# Patient Record
Sex: Male | Born: 1970 | Race: Black or African American | Hispanic: No | Marital: Married | Smoking: Never smoker
Health system: Southern US, Community
[De-identification: ages and names within clinical notes are randomized; demographics above are authoritative.]

## PROBLEM LIST (undated history)

## (undated) DIAGNOSIS — M1990 Unspecified osteoarthritis, unspecified site: Secondary | ICD-10-CM

## (undated) DIAGNOSIS — K219 Gastro-esophageal reflux disease without esophagitis: Secondary | ICD-10-CM

## (undated) DIAGNOSIS — T7840XA Allergy, unspecified, initial encounter: Secondary | ICD-10-CM

## (undated) DIAGNOSIS — G4730 Sleep apnea, unspecified: Secondary | ICD-10-CM

## (undated) DIAGNOSIS — F4310 Post-traumatic stress disorder, unspecified: Secondary | ICD-10-CM

## (undated) DIAGNOSIS — E785 Hyperlipidemia, unspecified: Secondary | ICD-10-CM

## (undated) HISTORY — DX: Unspecified osteoarthritis, unspecified site: M19.90

## (undated) HISTORY — PX: OTHER SURGICAL HISTORY: SHX169

## (undated) HISTORY — PX: COLONOSCOPY: SHX174

## (undated) HISTORY — DX: Allergy, unspecified, initial encounter: T78.40XA

## (undated) HISTORY — DX: Hyperlipidemia, unspecified: E78.5

## (undated) HISTORY — DX: Sleep apnea, unspecified: G47.30

## (undated) HISTORY — PX: HAND SURGERY: SHX662

## (undated) HISTORY — DX: Post-traumatic stress disorder, unspecified: F43.10

## (undated) HISTORY — PX: POLYPECTOMY: SHX149

## (undated) HISTORY — DX: Gastro-esophageal reflux disease without esophagitis: K21.9

---

## 2000-06-10 HISTORY — PX: APPENDECTOMY: SHX54

## 2011-07-25 ENCOUNTER — Ambulatory Visit: Payer: Self-pay | Admitting: Internal Medicine

## 2011-07-26 ENCOUNTER — Ambulatory Visit: Payer: Self-pay | Admitting: Internal Medicine

## 2011-08-09 ENCOUNTER — Ambulatory Visit (INDEPENDENT_AMBULATORY_CARE_PROVIDER_SITE_OTHER): Payer: BC Managed Care – PPO | Admitting: Internal Medicine

## 2011-08-09 ENCOUNTER — Encounter: Payer: Self-pay | Admitting: Internal Medicine

## 2011-08-09 VITALS — BP 126/70 | HR 62 | Temp 98.3°F | Resp 16 | Ht 69.0 in | Wt 238.0 lb

## 2011-08-09 DIAGNOSIS — E7800 Pure hypercholesterolemia, unspecified: Secondary | ICD-10-CM

## 2011-08-09 MED ORDER — ATORVASTATIN CALCIUM 40 MG PO TABS: 40.0000 mg | ORAL_TABLET | Freq: Every day | ORAL | Status: DC

## 2011-08-09 NOTE — Assessment & Plan Note (Signed)
His situation is complicated in that he tells me that he had a complete physical with labs today at the [facility] so he deferred on undergoing a complete exam or doing any labs with me today, he tells me that the [facility] will send him a copy of his labs next week and he will forward those results to me for my review. Today he wants to try a different statin so I wrote for lipitor.

## 2011-08-09 NOTE — Patient Instructions (Signed)

## 2011-08-09 NOTE — Progress Notes (Signed)
  Subjective:    Patient ID: Christopher Byrd, male    DOB: 1971/04/18, 41 y.o.   MRN: [id number]  Hyperlipidemia This is a chronic problem. The current episode started more than 1 year ago. The problem is uncontrolled. Recent lipid tests were reviewed and are variable. He has no history of chronic renal disease, diabetes, hypothyroidism, liver disease, obesity or nephrotic syndrome. Factors aggravating his hyperlipidemia include no known factors. Pertinent negatives include no chest pain, focal sensory loss, focal weakness, leg pain, myalgias or shortness of breath. Treatments tried: no meds for 2 weeks now. The current treatment provides moderate improvement of lipids. Compliance problems include adherence to diet and adherence to exercise.       Review of Systems  Constitutional: Negative.   HENT: Negative.   Eyes: Negative.   Respiratory: Negative.  Negative for shortness of breath.   Cardiovascular: Negative.  Negative for chest pain.  Gastrointestinal: Negative.   Genitourinary: Negative.   Musculoskeletal: Negative.  Negative for myalgias.  Skin: Negative.   Neurological: Negative.  Negative for focal weakness.  Hematological: Negative.   Psychiatric/Behavioral: Negative.        Objective:   Physical Exam  Vitals reviewed. Constitutional: He is oriented to person, place, and time. He appears well-developed and well-nourished. No distress.  HENT:  Head: Normocephalic and atraumatic.  Mouth/Throat: Oropharynx is clear and moist. No oropharyngeal exudate.  Eyes: Conjunctivae are normal. Right eye exhibits no discharge. Left eye exhibits no discharge. No scleral icterus.  Neck: Normal range of motion. Neck supple. No JVD present. No tracheal deviation present. No thyromegaly present.  Cardiovascular: Normal rate, regular rhythm, normal heart sounds and intact distal pulses.  Exam reveals no gallop and no friction rub.   No murmur heard. Pulmonary/Chest: Effort normal and breath  sounds normal. No stridor. No respiratory distress. He has no wheezes. He has no rales. He exhibits no tenderness.  Abdominal: Soft. Bowel sounds are normal. He exhibits no distension and no mass. There is no tenderness. There is no rebound and no guarding.  Musculoskeletal: Normal range of motion. He exhibits no edema and no tenderness.  Lymphadenopathy:    He has no cervical adenopathy.  Neurological: He is oriented to person, place, and time.  Skin: Skin is warm and dry. No rash noted. He is not diaphoretic. No erythema. No pallor.  Psychiatric: He has a normal mood and affect. His behavior is normal. Judgment and thought content normal.          Assessment & Plan:

## 2011-08-14 ENCOUNTER — Encounter: Payer: Self-pay | Admitting: Internal Medicine

## 2011-08-14 ENCOUNTER — Ambulatory Visit (INDEPENDENT_AMBULATORY_CARE_PROVIDER_SITE_OTHER): Payer: BC Managed Care – PPO | Admitting: Internal Medicine

## 2011-08-14 VITALS — BP 122/70 | HR 84 | Temp 99.2°F | Resp 16

## 2011-08-14 DIAGNOSIS — J209 Acute bronchitis, unspecified: Secondary | ICD-10-CM | POA: Insufficient documentation

## 2011-08-14 MED ORDER — HYDROCOD POLST-CPM POLST ER 10-8 MG PO CP12: 1.0000 | ORAL_CAPSULE | Freq: Two times a day (BID) | ORAL | Status: DC | PRN

## 2011-08-14 MED ORDER — AZITHROMYCIN 500 MG PO TABS: 500.0000 mg | ORAL_TABLET | Freq: Every day | ORAL | Status: AC

## 2011-08-14 NOTE — Patient Instructions (Signed)

## 2011-08-14 NOTE — Progress Notes (Signed)
  Subjective:    Patient ID: Christopher Byrd, male    DOB: 1970-07-10, 41 y.o.   MRN: [id number]  URI  This is a new problem. The current episode started in the past 7 days. The problem has been gradually worsening. There has been no fever. Associated symptoms include congestion, coughing, rhinorrhea and a sore throat. Pertinent negatives include no abdominal pain, chest pain, diarrhea, dysuria, ear pain, headaches, joint pain, joint swelling, nausea, neck pain, plugged ear sensation, rash, sinus pain, sneezing, swollen glands, vomiting or wheezing. He has tried nothing for the symptoms.      Review of Systems  Constitutional: Negative for fever, chills, diaphoresis, activity change, appetite change, fatigue and unexpected weight change.  HENT: Positive for congestion, sore throat and rhinorrhea. Negative for ear pain, nosebleeds, facial swelling, sneezing, neck pain, dental problem, postnasal drip, sinus pressure and ear discharge.   Eyes: Negative.   Respiratory: Positive for cough. Negative for choking, chest tightness, shortness of breath, wheezing and stridor.   Cardiovascular: Negative for chest pain.  Gastrointestinal: Negative for nausea, vomiting, abdominal pain, diarrhea, constipation and blood in stool.  Genitourinary: Negative.  Negative for dysuria.  Musculoskeletal: Negative for myalgias, back pain, joint pain, joint swelling, arthralgias and gait problem.  Skin: Negative for color change, pallor, rash and wound.  Neurological: Negative.  Negative for headaches.  Hematological: Negative for adenopathy. Does not bruise/bleed easily.  Psychiatric/Behavioral: Negative.        Objective:   Physical Exam  Vitals reviewed. Constitutional: He is oriented to person, place, and time. He appears well-developed and well-nourished. No distress.  HENT:  Head: Normocephalic and atraumatic.  Mouth/Throat: Oropharynx is clear and moist. No oropharyngeal exudate.  Eyes: Conjunctivae are  normal. Right eye exhibits no discharge. Left eye exhibits no discharge. No scleral icterus.  Neck: Normal range of motion. Neck supple. No JVD present. No tracheal deviation present. No thyromegaly present.  Cardiovascular: Normal rate, regular rhythm, normal heart sounds and intact distal pulses.  Exam reveals no gallop and no friction rub.   No murmur heard. Pulmonary/Chest: Effort normal and breath sounds normal. No stridor. No respiratory distress. He has no wheezes. He has no rales. He exhibits no tenderness.  Abdominal: Soft. Bowel sounds are normal. He exhibits no distension and no mass. There is no tenderness. There is no rebound and no guarding.  Musculoskeletal: Normal range of motion. He exhibits no edema and no tenderness.  Lymphadenopathy:    He has no cervical adenopathy.  Neurological: He is oriented to person, place, and time.  Skin: Skin is warm and dry. No rash noted. He is not diaphoretic. No erythema. No pallor.  Psychiatric: He has a normal mood and affect. His behavior is normal. Judgment and thought content normal.          Assessment & Plan:

## 2011-08-14 NOTE — Assessment & Plan Note (Signed)
Start zpak for the infection and a cough suppressant 

## 2011-08-23 ENCOUNTER — Telehealth: Payer: Self-pay | Admitting: Internal Medicine

## 2011-08-23 NOTE — Telephone Encounter (Signed)
Received copies from [facility] ,on 08/23/11. Forwarded 14 pages to Dr. Candi Leash review.

## 2011-09-27 ENCOUNTER — Other Ambulatory Visit: Payer: Self-pay | Admitting: Occupational Medicine

## 2011-09-27 ENCOUNTER — Ambulatory Visit: Payer: Self-pay

## 2011-09-27 DIAGNOSIS — R52 Pain, unspecified: Secondary | ICD-10-CM

## 2012-02-24 ENCOUNTER — Ambulatory Visit (INDEPENDENT_AMBULATORY_CARE_PROVIDER_SITE_OTHER): Payer: BC Managed Care – PPO | Admitting: Internal Medicine

## 2012-02-24 ENCOUNTER — Encounter: Payer: Self-pay | Admitting: Internal Medicine

## 2012-02-24 VITALS — BP 110/70 | HR 68 | Temp 98.3°F | Resp 16 | Wt 245.5 lb

## 2012-02-24 DIAGNOSIS — Z23 Encounter for immunization: Secondary | ICD-10-CM

## 2012-02-24 DIAGNOSIS — L247 Irritant contact dermatitis due to plants, except food: Secondary | ICD-10-CM

## 2012-02-24 DIAGNOSIS — L255 Unspecified contact dermatitis due to plants, except food: Secondary | ICD-10-CM

## 2012-02-24 MED ORDER — CLOBETASOL PROPIONATE 0.05 % EX OINT: TOPICAL_OINTMENT | Freq: Two times a day (BID) | CUTANEOUS | Status: DC

## 2012-02-24 MED ORDER — METHYLPREDNISOLONE ACETATE 80 MG/ML IJ SUSP
120.0000 mg | Freq: Once | INTRAMUSCULAR | Status: AC
  Administered 2012-02-24: 120 mg via INTRAMUSCULAR

## 2012-02-24 NOTE — Assessment & Plan Note (Signed)
He was given depo-medrol in the office and will continue to treat with topical steroids

## 2012-02-24 NOTE — Progress Notes (Signed)
  Subjective:    Patient ID: Christopher Byrd, male    DOB: [birth year], 41 y.o.   MRN: [id number]  Poison [place] This is a recurrent problem. The current episode started in the past 7 days. The problem is unchanged. The affected locations include the left lower leg and right lower leg. The rash is characterized by itchiness, dryness and scaling. He was exposed to plant contact. Pertinent negatives include no anorexia, cough, diarrhea, eye pain, facial edema, fatigue, fever, joint pain, nail changes, rhinorrhea, shortness of breath, sore throat or vomiting. Past treatments include anti-itch cream. The treatment provided mild relief.      Review of Systems  Constitutional: Negative for fever, chills, diaphoresis, activity change, appetite change, fatigue and unexpected weight change.  HENT: Negative.  Negative for sore throat and rhinorrhea.   Eyes: Negative.  Negative for pain.  Respiratory: Negative for cough, chest tightness, shortness of breath, wheezing and stridor.   Cardiovascular: Negative for chest pain, palpitations and leg swelling.  Gastrointestinal: Negative for nausea, vomiting, abdominal pain, diarrhea, constipation, blood in stool and anorexia.  Genitourinary: Negative.   Musculoskeletal: Negative.  Negative for joint pain.  Skin: Positive for rash. Negative for nail changes, color change, pallor and wound.  Neurological: Negative.   Hematological: Negative for adenopathy. Does not bruise/bleed easily.  Psychiatric/Behavioral: Negative.        Objective:   Physical Exam  Vitals reviewed. Constitutional: He appears well-developed and well-nourished. No distress.  HENT:  Head: Normocephalic and atraumatic.  Mouth/Throat: Oropharynx is clear and moist. No oropharyngeal exudate.  Eyes: Conjunctivae normal are normal. Right eye exhibits no discharge. Left eye exhibits no discharge. No scleral icterus.  Neck: Normal range of motion. Neck supple. No JVD present. No tracheal  deviation present. No thyromegaly present.  Cardiovascular: Normal rate, regular rhythm, normal heart sounds and intact distal pulses.  Exam reveals no gallop and no friction rub.   No murmur heard. Pulmonary/Chest: Effort normal and breath sounds normal. No stridor. No respiratory distress. He has no wheezes. He has no rales. He exhibits no tenderness.  Abdominal: Soft. Bowel sounds are normal. He exhibits no distension and no mass. There is no tenderness. There is no rebound and no guarding.  Lymphadenopathy:    He has no cervical adenopathy.  Skin: Skin is warm, dry and intact. Rash noted. No purpura noted. Rash is papular. Rash is not macular, not maculopapular, not nodular, not pustular, not vesicular and not urticarial. He is not diaphoretic.          On his LE's there are classic erythematous papules in streaks and groups          Assessment & Plan:

## 2012-02-24 NOTE — Patient Instructions (Signed)
Poison Ivy Poison ivy is a inflammation of the skin (contact dermatitis) caused by touching the allergens on the leaves of the ivy plant following previous exposure to the plant. The rash usually appears 48 hours after exposure. The rash is usually bumps (papules) or blisters (vesicles) in a linear pattern. Depending on your own sensitivity, the rash may simply cause redness and itching, or it may also progress to blisters which may break open. These must be well cared for to prevent secondary bacterial (germ) infection, followed by scarring. Keep any open areas dry, clean, dressed, and covered with an antibacterial ointment if needed. The eyes may also get puffy. The puffiness is worst in the morning and gets better as the day progresses. This dermatitis usually heals without scarring, within 2 to 3 weeks without treatment. HOME CARE INSTRUCTIONS  Thoroughly wash with soap and water as soon as you have been exposed to poison ivy. You have about one half hour to remove the plant resin before it will cause the rash. This washing will destroy the oil or antigen on the skin that is causing, or will cause, the rash. Be sure to wash under your fingernails as any plant resin there will continue to spread the rash. Do not rub skin vigorously when washing affected area. Poison ivy cannot spread if no oil from the plant remains on your body. A rash that has progressed to weeping sores will not spread the rash unless you have not washed thoroughly. It is also important to wash any clothes you have been wearing as these may carry active allergens. The rash will return if you wear the unwashed clothing, even several days later. Avoidance of the plant in the future is the best measure. Poison ivy plant can be recognized by the number of leaves. Generally, poison ivy has three leaves with flowering branches on a single stem. Diphenhydramine may be purchased over the counter and used as needed for itching. Do not drive with  this medication if it makes you drowsy.Ask your caregiver about medication for children. SEEK MEDICAL CARE IF:  Open sores develop.   Redness spreads beyond area of rash.   You notice purulent (pus-like) discharge.   You have increased pain.   Other signs of infection develop (such as fever).  Document Released: 05/24/2000 Document Revised: 05/16/2011 Document Reviewed: 04/12/2009 [employer] Patient Information 2012 [employer]. 

## 2012-04-08 ENCOUNTER — Ambulatory Visit: Payer: BC Managed Care – PPO | Admitting: General Practice

## 2012-04-08 DIAGNOSIS — Z23 Encounter for immunization: Secondary | ICD-10-CM

## 2012-04-08 MED ORDER — HEPATITIS A VACCINE 1440 EL U/ML IM SUSP: 1.0000 mL | Freq: Once | INTRAMUSCULAR | Status: DC

## 2012-05-11 ENCOUNTER — Other Ambulatory Visit (INDEPENDENT_AMBULATORY_CARE_PROVIDER_SITE_OTHER): Payer: BC Managed Care – PPO

## 2012-05-11 ENCOUNTER — Encounter: Payer: Self-pay | Admitting: Internal Medicine

## 2012-05-11 ENCOUNTER — Ambulatory Visit (INDEPENDENT_AMBULATORY_CARE_PROVIDER_SITE_OTHER): Payer: BC Managed Care – PPO | Admitting: Internal Medicine

## 2012-05-11 ENCOUNTER — Ambulatory Visit (INDEPENDENT_AMBULATORY_CARE_PROVIDER_SITE_OTHER)
Admission: RE | Admit: 2012-05-11 | Discharge: 2012-05-11 | Disposition: A | Discharge status: Home / Self Care | Payer: BC Managed Care – PPO | Source: Ambulatory Visit | Attending: Internal Medicine | Admitting: Internal Medicine

## 2012-05-11 VITALS — BP 118/68 | HR 58 | Temp 97.7°F | Resp 16 | Wt 240.0 lb

## 2012-05-11 DIAGNOSIS — M25569 Pain in unspecified knee: Secondary | ICD-10-CM

## 2012-05-11 DIAGNOSIS — R5381 Other malaise: Secondary | ICD-10-CM

## 2012-05-11 DIAGNOSIS — E7800 Pure hypercholesterolemia, unspecified: Secondary | ICD-10-CM

## 2012-05-11 DIAGNOSIS — Z0000 Encounter for general adult medical examination without abnormal findings: Secondary | ICD-10-CM | POA: Insufficient documentation

## 2012-05-11 DIAGNOSIS — R5383 Other fatigue: Secondary | ICD-10-CM | POA: Insufficient documentation

## 2012-05-11 DIAGNOSIS — M25562 Pain in left knee: Secondary | ICD-10-CM | POA: Insufficient documentation

## 2012-05-11 DIAGNOSIS — M2340 Loose body in knee, unspecified knee: Secondary | ICD-10-CM

## 2012-05-11 LAB — LIPID PANEL
Total CHOL/HDL Ratio: 3
Triglycerides: 83 mg/dL (ref 0.0–149.0)

## 2012-05-11 LAB — URINALYSIS, ROUTINE W REFLEX MICROSCOPIC
Leukocytes, UA: NEGATIVE
Specific Gravity, Urine: 1.02 (ref 1.000–1.030)
Urine Glucose: NEGATIVE
Urobilinogen, UA: 0.2 (ref 0.0–1.0)
pH: 7 (ref 5.0–8.0)

## 2012-05-11 LAB — COMPREHENSIVE METABOLIC PANEL
AST: 29 U/L (ref 0–37)
Albumin: 4 g/dL (ref 3.5–5.2)
BUN: 14 mg/dL (ref 6–23)
CO2: 29 mEq/L (ref 19–32)
Calcium: 9.2 mg/dL (ref 8.4–10.5)
Chloride: 103 mEq/L (ref 96–112)
GFR: 90.99 mL/min (ref >60.00)
Glucose, Bld: 95 mg/dL (ref 70–99)
Potassium: 4.4 mEq/L (ref 3.5–5.1)

## 2012-05-11 LAB — CBC WITH DIFFERENTIAL/PLATELET
Basophils Absolute: 0 10*3/uL (ref 0.0–0.1)
Eosinophils Absolute: 0 10*3/uL (ref 0.0–0.7)
Lymphocytes Relative: 21.5 % (ref 12.0–46.0)
MCHC: 33.5 g/dL (ref 30.0–36.0)
MCV: 89.6 fl (ref 78.0–100.0)
Monocytes Absolute: 0.5 10*3/uL (ref 0.1–1.0)
Neutrophils Relative %: 69.8 % (ref 43.0–77.0)
RDW: 13.8 % (ref 11.5–14.6)

## 2012-05-11 LAB — TSH: TSH: 1.02 u[IU]/mL (ref 0.35–5.50)

## 2012-05-11 NOTE — Addendum Note (Signed)
Addended by: Etta Grandchild on: 05/11/2012 01:03 PM   Modules accepted: Orders

## 2012-05-11 NOTE — Assessment & Plan Note (Signed)
Exam done Vaccines were reviewed Labs ordered Pt ed material was given 

## 2012-05-11 NOTE — Progress Notes (Signed)
Subjective:    Patient ID: Christopher Byrd, male    DOB: Jul 02, 1970, 41 y.o.   MRN: [id number]  Knee Pain  The incident occurred 3 to 5 days ago. There was no injury mechanism. The pain is present in the left knee. The quality of the pain is described as aching. The pain is at a severity of 1/10 (he feels like his left knee "catches"). The pain is mild. The pain has been intermittent since onset. Pertinent negatives include no inability to bear weight, loss of motion, loss of sensation, muscle weakness, numbness or tingling. He reports no foreign bodies present. The symptoms are aggravated by movement. He has tried nothing for the symptoms. The treatment provided no relief.      Review of Systems  Constitutional: Positive for fatigue. Negative for fever, diaphoresis, activity change, appetite change and unexpected weight change.  HENT: Negative.   Eyes: Negative.   Respiratory: Negative.  Negative for apnea, cough, choking, chest tightness, shortness of breath and wheezing.   Cardiovascular: Negative.  Negative for chest pain, palpitations and leg swelling.  Gastrointestinal: Negative.  Negative for nausea, vomiting, abdominal pain, diarrhea, constipation and blood in stool.  Genitourinary: Negative.   Musculoskeletal: Positive for arthralgias (left knee). Negative for myalgias, back pain, joint swelling and gait problem.  Skin: Negative.  Negative for color change, pallor, rash and wound.  Neurological: Negative.  Negative for tingling and numbness.  Hematological: Negative.   Psychiatric/Behavioral: Negative.        Objective:   Physical Exam  Vitals reviewed. Constitutional: He is oriented to person, place, and time. He appears well-developed and well-nourished. No distress.  HENT:  Head: Normocephalic and atraumatic.  Mouth/Throat: Oropharynx is clear and moist. No oropharyngeal exudate.  Eyes: Conjunctivae normal are normal. Right eye exhibits no discharge. Left eye exhibits no  discharge. No scleral icterus.  Neck: Normal range of motion. Neck supple. No JVD present. No tracheal deviation present. No thyromegaly present.  Cardiovascular: Normal rate, regular rhythm, normal heart sounds and intact distal pulses.  Exam reveals no gallop and no friction rub.   No murmur heard. Pulmonary/Chest: Effort normal and breath sounds normal. No stridor. No respiratory distress. He has no wheezes. He has no rales. He exhibits no tenderness.  Abdominal: Soft. Bowel sounds are normal. He exhibits no distension and no mass. There is no tenderness. There is no rebound and no guarding. Hernia confirmed negative in the right inguinal area and confirmed negative in the left inguinal area.  Genitourinary: Rectum normal, prostate normal, testes normal and penis normal. Rectal exam shows no external hemorrhoid, no internal hemorrhoid, no fissure, no mass, no tenderness and anal tone normal. Guaiac negative stool. Prostate is not enlarged and not tender. Right testis shows no mass, no swelling and no tenderness. Right testis is descended. Left testis shows no mass, no swelling and no tenderness. Left testis is descended. Circumcised. No penile erythema or penile tenderness. No discharge found.  Musculoskeletal: Normal range of motion. He exhibits no edema and no tenderness.       Left knee: He exhibits deformity (very mild crepitance). He exhibits normal range of motion, no swelling, no effusion, no ecchymosis, no laceration, normal alignment, normal patellar mobility, no bony tenderness, normal meniscus and no MCL laxity. no tenderness found. No medial joint line, no lateral joint line, no MCL, no LCL and no patellar tendon tenderness noted.  Lymphadenopathy:    He has no cervical adenopathy.       Right:  No inguinal adenopathy present.       Left: No inguinal adenopathy present.  Neurological: He is oriented to person, place, and time.  Skin: Skin is warm and dry. No rash noted. He is not  diaphoretic. No erythema. No pallor.  Psychiatric: He has a normal mood and affect. His behavior is normal. Judgment and thought content normal.      No results found for this basename: WBC, HGB, HCT, PLT, GLUCOSE, CHOL, TRIG, HDL, LDLDIRECT, LDLCALC, ALT, AST, NA, K, CL, CREATININE, BUN, CO2, TSH, PSA, INR, GLUF, HGBA1C, MICROALBUR      Assessment & Plan:

## 2012-05-11 NOTE — Assessment & Plan Note (Signed)
See xray report, ortho referral

## 2012-05-11 NOTE — Assessment & Plan Note (Signed)
I will check his testosterone level and other labs to see if there is a secondary cause for this

## 2012-05-11 NOTE — Patient Instructions (Signed)
Knee Pain The knee is the complex joint between your thigh and your lower leg. It is made up of bones, tendons, ligaments, and cartilage. The bones that make up the knee are:  The femur in the thigh.  The tibia and fibula in the lower leg.  The patella or kneecap riding in the groove on the lower femur. CAUSES  Knee pain is a common complaint with many causes. A few of these causes are:  Injury, such as:  A ruptured ligament or tendon injury.  Torn cartilage.  Medical conditions, such as:  Gout  Arthritis  Infections  Overuse, over training or overdoing a physical activity. Knee pain can be minor or severe. Knee pain can accompany debilitating injury. Minor knee problems often respond well to self-care measures or get well on their own. More serious injuries may need medical intervention or even surgery. SYMPTOMS The knee is complex. Symptoms of knee problems can vary widely. Some of the problems are:  Pain with movement and weight bearing.  Swelling and tenderness.  Buckling of the knee.  Inability to straighten or extend your knee.  Your knee locks and you cannot straighten it.  Warmth and redness with pain and fever.  Deformity or dislocation of the kneecap. DIAGNOSIS  Determining what is wrong may be very straight forward such as when there is an injury. It can also be challenging because of the complexity of the knee. Tests to make a diagnosis may include:  Your caregiver taking a history and doing a physical exam.  Routine X-rays can be used to rule out other problems. X-rays will not reveal a cartilage tear. Some injuries of the knee can be diagnosed by:  Arthroscopy a surgical technique by which a small video camera is inserted through tiny incisions on the sides of the knee. This procedure is used to examine and repair internal knee joint problems. Tiny instruments can be used during arthroscopy to repair the torn knee cartilage (meniscus).  Arthrography  is a radiology technique. A contrast liquid is directly injected into the knee joint. Internal structures of the knee joint then become visible on X-ray film.  An MRI scan is a non x-ray radiology procedure in which magnetic fields and a computer produce two- or three-dimensional images of the inside of the knee. Cartilage tears are often visible using an MRI scanner. MRI scans have largely replaced arthrography in diagnosing cartilage tears of the knee.  Blood work.  Examination of the fluid that helps to lubricate the knee joint (synovial fluid). This is done by taking a sample out using a needle and a syringe. TREATMENT The treatment of knee problems depends on the cause. Some of these treatments are:  Depending on the injury, proper casting, splinting, surgery or physical therapy care will be needed.  Give yourself adequate recovery time. Do not overuse your joints. If you begin to get sore during workout routines, back off. Slow down or do fewer repetitions.  For repetitive activities such as cycling or running, maintain your strength and nutrition.  Alternate muscle groups. For example if you are a weight lifter, work the upper body on one day and the lower body the next.  Either tight or weak muscles do not give the proper support for your knee. Tight or weak muscles do not absorb the stress placed on the knee joint. Keep the muscles surrounding the knee strong.  Take care of mechanical problems.  If you have flat feet, orthotics or special shoes may help.   See your caregiver if you need help.  Arch supports, sometimes with wedges on the inner or outer aspect of the heel, can help. These can shift pressure away from the side of the knee most bothered by osteoarthritis.  A brace called an "unloader" brace also may be used to help ease the pressure on the most arthritic side of the knee.  If your caregiver has prescribed crutches, braces, wraps or ice, use as directed. The acronym for  this is PRICE. This means protection, rest, ice, compression and elevation.  Nonsteroidal anti-inflammatory drugs (NSAID's), can help relieve pain. But if taken immediately after an injury, they may actually increase swelling. Take NSAID's with food in your stomach. Stop them if you develop stomach problems. Do not take these if you have a history of ulcers, stomach pain or bleeding from the bowel. Do not take without your caregiver's approval if you have problems with fluid retention, heart failure, or kidney problems.  For ongoing knee problems, physical therapy may be helpful.  Glucosamine and chondroitin are over-the-counter dietary supplements. Both may help relieve the pain of osteoarthritis in the knee. These medicines are different from the usual anti-inflammatory drugs. Glucosamine may decrease the rate of cartilage destruction.  Injections of a corticosteroid drug into your knee joint may help reduce the symptoms of an arthritis flare-up. They may provide pain relief that lasts a few months. You may have to wait a few months between injections. The injections do have a small increased risk of infection, water retention and elevated blood sugar levels.  Hyaluronic acid injected into damaged joints may ease pain and provide lubrication. These injections may work by reducing inflammation. A series of shots may give relief for as long as 6 months.  Topical painkillers. Applying certain ointments to your skin may help relieve the pain and stiffness of osteoarthritis. Ask your pharmacist for suggestions. Many over the-counter products are approved for temporary relief of arthritis pain.  In some countries, doctors often prescribe topical NSAID's for relief of chronic conditions such as arthritis and tendinitis. A review of treatment with NSAID creams found that they worked as well as oral medications but without the serious side effects. PREVENTION  Maintain a healthy weight. Extra pounds put  more strain on your joints.  Get strong, stay limber. Weak muscles are a common cause of knee injuries. Stretching is important. Include flexibility exercises in your workouts.  Be smart about exercise. If you have osteoarthritis, chronic knee pain or recurring injuries, you may need to change the way you exercise. This does not mean you have to stop being active. If your knees ache after jogging or playing basketball, consider switching to swimming, water aerobics or other low-impact activities, at least for a few days a week. Sometimes limiting high-impact activities will provide relief.  Make sure your shoes fit well. Choose footwear that is right for your sport.  Protect your knees. Use the proper gear for knee-sensitive activities. Use kneepads when playing volleyball or laying carpet. Buckle your seat belt every time you drive. Most shattered kneecaps occur in car accidents.  Rest when you are tired. SEEK MEDICAL CARE IF:  You have knee pain that is continual and does not seem to be getting better.  SEEK IMMEDIATE MEDICAL CARE IF:  Your knee joint feels hot to the touch and you have a high fever. MAKE SURE YOU:   Understand these instructions.  Will watch your condition.  Will get help right away if you are not   doing well or get worse. Document Released: 03/24/2007 Document Revised: 08/19/2011 Document Reviewed: 03/24/2007 [employer] Patient Information 2013 [employer]. Health Maintenance, Males A healthy lifestyle and preventative care can promote health and wellness.  Maintain regular health, dental, and eye exams.  Eat a healthy diet. Foods like vegetables, fruits, whole grains, low-fat dairy products, and lean protein foods contain the nutrients you need without too many calories. Decrease your intake of foods high in solid fats, added sugars, and salt. Get information about a proper diet from your caregiver, if necessary.  Regular physical exercise is one of the most  important things you can do for your health. Most adults should get at least 150 minutes of moderate-intensity exercise (any activity that increases your heart rate and causes you to sweat) each week. In addition, most adults need muscle-strengthening exercises on 2 or more days a week.   Maintain a healthy weight. The body mass index (BMI) is a screening tool to identify possible weight problems. It provides an estimate of body fat based on height and weight. Your caregiver can help determine your BMI, and can help you achieve or maintain a healthy weight. For adults 20 years and older:  A BMI below 18.5 is considered underweight.  A BMI of 18.5 to 24.9 is normal.  A BMI of 25 to 29.9 is considered overweight.  A BMI of 30 and above is considered obese.  Maintain normal blood lipids and cholesterol by exercising and minimizing your intake of saturated fat. Eat a balanced diet with plenty of fruits and vegetables. Blood tests for lipids and cholesterol should begin at age 20 and be repeated every 5 years. If your lipid or cholesterol levels are high, you are over 50, or you are a high risk for heart disease, you may need your cholesterol levels checked more frequently.Ongoing high lipid and cholesterol levels should be treated with medicines, if diet and exercise are not effective.  If you smoke, find out from your caregiver how to quit. If you do not use tobacco, do not start.  If you choose to drink alcohol, do not exceed 2 drinks per day. One drink is considered to be 12 ounces (355 mL) of beer, 5 ounces (148 mL) of wine, or 1.5 ounces (44 mL) of liquor.  Avoid use of street drugs. Do not share needles with anyone. Ask for help if you need support or instructions about stopping the use of drugs.  High blood pressure causes heart disease and increases the risk of stroke. Blood pressure should be checked at least every 1 to 2 years. Ongoing high blood pressure should be treated with medicines  if weight loss and exercise are not effective.  If you are 45 to 43 years old, ask your caregiver if you should take aspirin to prevent heart disease.  Diabetes screening involves taking a blood sample to check your fasting blood sugar level. This should be done once every 3 years, after age 45, if you are within normal weight and without risk factors for diabetes. Testing should be considered at a younger age or be carried out more frequently if you are overweight and have at least 1 risk factor for diabetes.  Colorectal cancer can be detected and often prevented. Most routine colorectal cancer screening begins at the age of 50 and continues through age 75. However, your caregiver may recommend screening at an earlier age if you have risk factors for colon cancer. On a yearly basis, your caregiver may provide home   test kits to check for hidden blood in the stool. Use of a small camera at the end of a tube, to directly examine the colon (sigmoidoscopy or colonoscopy), can detect the earliest forms of colorectal cancer. Talk to your caregiver about this at age 50, when routine screening begins. Direct examination of the colon should be repeated every 5 to 10 years through age 75, unless early forms of pre-cancerous polyps or small growths are found.  Hepatitis C blood testing is recommended for all people born from 1945 through 1965 and any individual with known risks for hepatitis C.  Healthy men should no longer receive prostate-specific antigen (PSA) blood tests as part of routine cancer screening. Consult with your caregiver about prostate cancer screening.  Testicular cancer screening is not recommended for adolescents or adult males who have no symptoms. Screening includes self-exam, caregiver exam, and other screening tests. Consult with your caregiver about any symptoms you have or any concerns you have about testicular cancer.  Practice safe sex. Use condoms and avoid high-risk sexual practices  to reduce the spread of sexually transmitted infections (STIs).  Use sunscreen with a sun protection factor (SPF) of 30 or greater. Apply sunscreen liberally and repeatedly throughout the day. You should seek shade when your shadow is shorter than you. Protect yourself by wearing long sleeves, pants, a wide-brimmed hat, and sunglasses year round, whenever you are outdoors.  Notify your caregiver of new moles or changes in moles, especially if there is a change in shape or color. Also notify your caregiver if a mole is larger than the size of a pencil eraser.  A one-time screening for abdominal aortic aneurysm (AAA) and surgical repair of large AAAs by sound wave imaging (ultrasonography) is recommended for ages 65 to 75 years who are current or former smokers.  Stay current with your immunizations. Document Released: 11/23/2007 Document Revised: 08/19/2011 Document Reviewed: 10/22/2010 [employer] Patient Information 2013 [employer].  

## 2012-05-11 NOTE — Assessment & Plan Note (Signed)
I will check a plain film today to see if there is any DJD

## 2012-10-19 ENCOUNTER — Encounter: Payer: Self-pay | Admitting: Internal Medicine

## 2012-10-19 ENCOUNTER — Ambulatory Visit (INDEPENDENT_AMBULATORY_CARE_PROVIDER_SITE_OTHER): Payer: BC Managed Care – PPO | Admitting: Internal Medicine

## 2012-10-19 VITALS — BP 118/68 | HR 60 | Temp 97.8°F | Resp 16 | Wt 241.0 lb

## 2012-10-19 DIAGNOSIS — E7800 Pure hypercholesterolemia, unspecified: Secondary | ICD-10-CM

## 2012-10-19 DIAGNOSIS — H918X9 Other specified hearing loss, unspecified ear: Secondary | ICD-10-CM

## 2012-10-19 DIAGNOSIS — H6120 Impacted cerumen, unspecified ear: Secondary | ICD-10-CM

## 2012-10-19 MED ORDER — ATORVASTATIN CALCIUM 40 MG PO TABS: 40.0000 mg | ORAL_TABLET | Freq: Every day | ORAL | Status: DC

## 2012-10-19 NOTE — Assessment & Plan Note (Signed)
He is doing well on lipitor 

## 2012-10-19 NOTE — Assessment & Plan Note (Signed)
Success irrigation

## 2012-10-19 NOTE — Patient Instructions (Signed)

## 2012-10-19 NOTE — Progress Notes (Signed)
Subjective:    Patient ID: Christopher Byrd, male    DOB: Sep 22, 1970, 42 y.o.   MRN: [id number]  Hyperlipidemia This is a chronic problem. The current episode started more than 1 year ago. The problem is controlled. Recent lipid tests were reviewed and are variable. He has no history of chronic renal disease, diabetes, hypothyroidism, liver disease, obesity or nephrotic syndrome. There are no known factors aggravating his hyperlipidemia. Pertinent negatives include no chest pain, focal sensory loss, focal weakness, leg pain, myalgias or shortness of breath. Current antihyperlipidemic treatment includes statins. The current treatment provides significant improvement of lipids. There are no compliance problems.       Review of Systems  Constitutional: Negative.  Negative for fever, chills, diaphoresis, activity change, appetite change, fatigue and unexpected weight change.  HENT: Positive for hearing loss (wax in his left ear). Negative for ear pain, facial swelling, drooling, dental problem, sinus pressure and tinnitus.   Eyes: Negative.   Respiratory: Negative.  Negative for cough, choking, chest tightness, shortness of breath, wheezing and stridor.   Cardiovascular: Negative.  Negative for chest pain, palpitations and leg swelling.  Gastrointestinal: Negative.  Negative for nausea, vomiting, abdominal pain, diarrhea and constipation.  Endocrine: Negative.   Genitourinary: Negative.   Musculoskeletal: Negative.  Negative for myalgias, back pain, joint swelling, arthralgias and gait problem.  Skin: Negative.   Allergic/Immunologic: Negative.   Neurological: Negative.  Negative for focal weakness.  Hematological: Negative.   Psychiatric/Behavioral: Negative.        Objective:   Physical Exam  Vitals reviewed. Constitutional: He is oriented to person, place, and time. He appears well-developed and well-nourished. No distress.  HENT:  Head: Normocephalic and atraumatic.  Right Ear:  Hearing, tympanic membrane, external ear and ear canal normal.  Left Ear: Hearing and external ear normal. A foreign body (cerumen impaction on left EAC) is present.  Mouth/Throat: No oropharyngeal exudate.  Colace was placed in left eac, then it was irrigated, cerumen was easily removed, after the irrigation was done I reexamined the ear and found it to be normal  Eyes: Conjunctivae are normal. Right eye exhibits no discharge. Left eye exhibits no discharge. No scleral icterus.  Neck: Normal range of motion. Neck supple. No JVD present. No tracheal deviation present. No thyromegaly present.  Cardiovascular: Normal rate, regular rhythm, normal heart sounds and intact distal pulses.  Exam reveals no gallop and no friction rub.   No murmur heard. Pulmonary/Chest: Effort normal and breath sounds normal. No stridor. No respiratory distress. He has no wheezes. He has no rales. He exhibits no tenderness.  Abdominal: Soft. Bowel sounds are normal. He exhibits no distension and no mass. There is no tenderness. There is no rebound and no guarding.  Musculoskeletal: Normal range of motion. He exhibits no edema and no tenderness.  Lymphadenopathy:    He has no cervical adenopathy.  Neurological: He is oriented to person, place, and time.  Skin: Skin is warm and dry. No rash noted. He is not diaphoretic. No erythema. No pallor.  Psychiatric: He has a normal mood and affect. His behavior is normal. Judgment and thought content normal.     Lab Results  Component Value Date   WBC 5.9 05/11/2012   HGB 15.1 05/11/2012   HCT 45.1 05/11/2012   PLT 202.0 05/11/2012   GLUCOSE 95 05/11/2012   CHOL 144 05/11/2012   TRIG 83.0 05/11/2012   HDL 42.90 05/11/2012   LDLCALC 85 05/11/2012   ALT 42 05/11/2012  AST 29 05/11/2012   NA 139 05/11/2012   K 4.4 05/11/2012   CL 103 05/11/2012   CREATININE 1.1 05/11/2012   BUN 14 05/11/2012   CO2 29 05/11/2012   TSH 1.02 05/11/2012   PSA 0.27 05/11/2012       Assessment & Plan:

## 2012-12-10 ENCOUNTER — Ambulatory Visit (INDEPENDENT_AMBULATORY_CARE_PROVIDER_SITE_OTHER): Payer: BC Managed Care – PPO | Admitting: Internal Medicine

## 2012-12-10 ENCOUNTER — Encounter: Payer: Self-pay | Admitting: Internal Medicine

## 2012-12-10 ENCOUNTER — Ambulatory Visit (INDEPENDENT_AMBULATORY_CARE_PROVIDER_SITE_OTHER)
Admission: RE | Admit: 2012-12-10 | Discharge: 2012-12-10 | Disposition: A | Discharge status: Home / Self Care | Payer: BC Managed Care – PPO | Source: Ambulatory Visit | Attending: Internal Medicine | Admitting: Internal Medicine

## 2012-12-10 VITALS — BP 110/76 | HR 64 | Temp 98.4°F | Resp 16 | Wt 237.0 lb

## 2012-12-10 DIAGNOSIS — M545 Low back pain, unspecified: Secondary | ICD-10-CM

## 2012-12-10 DIAGNOSIS — M5416 Radiculopathy, lumbar region: Secondary | ICD-10-CM | POA: Insufficient documentation

## 2012-12-10 MED ORDER — TRAMADOL HCL 50 MG PO TABS: 50.0000 mg | ORAL_TABLET | Freq: Three times a day (TID) | ORAL | Status: DC | PRN

## 2012-12-10 NOTE — Assessment & Plan Note (Signed)
He will continue the meds given to him by the [place] He will also try tramadol for pain relief

## 2012-12-10 NOTE — Progress Notes (Signed)
Subjective:    Patient ID: Christopher Byrd, male    DOB: [birth year], 42 y.o.   MRN: [id number]  Back Pain This is a recurrent problem. The current episode started in the past 7 days. The problem occurs intermittently. The problem is unchanged. The pain is present in the lumbar spine. The quality of the pain is described as aching. The pain does not radiate. The pain is at a severity of 3/10. The pain is mild. The pain is worse during the day. The symptoms are aggravated by bending and standing. Pertinent negatives include no abdominal pain, bladder incontinence, bowel incontinence, chest pain, dysuria, fever, headaches, leg pain, numbness, paresis, paresthesias, pelvic pain, perianal numbness, tingling, weakness or weight loss. He has tried NSAIDs and muscle relaxant (? meds given to him by the [place]) for the symptoms. The treatment provided mild relief.      Review of Systems  Constitutional: Negative.  Negative for fever and weight loss.  HENT: Negative.   Eyes: Negative.   Respiratory: Negative.   Cardiovascular: Negative.  Negative for chest pain, palpitations and leg swelling.  Gastrointestinal: Negative.  Negative for abdominal pain and bowel incontinence.  Endocrine: Negative.   Genitourinary: Negative.  Negative for bladder incontinence, dysuria and pelvic pain.  Musculoskeletal: Positive for back pain. Negative for myalgias, joint swelling, arthralgias and gait problem.  Skin: Negative.   Allergic/Immunologic: Negative.   Neurological: Negative for dizziness, tingling, tremors, weakness, numbness, headaches and paresthesias.  Hematological: Negative.  Negative for adenopathy. Does not bruise/bleed easily.  Psychiatric/Behavioral: Negative.        Objective:   Physical Exam  Vitals reviewed. Constitutional: He is oriented to person, place, and time. He appears well-developed and well-nourished. No distress.  HENT:  Head: Normocephalic and atraumatic.  Mouth/Throat: Oropharynx  is clear and moist. No oropharyngeal exudate.  Eyes: Conjunctivae are normal. Right eye exhibits no discharge. No scleral icterus.  Neck: Normal range of motion. Neck supple. No JVD present. No tracheal deviation present. No thyromegaly present.  Cardiovascular: Normal rate, regular rhythm, normal heart sounds and intact distal pulses.  Exam reveals no gallop and no friction rub.   No murmur heard. Pulmonary/Chest: Effort normal and breath sounds normal. No stridor. No respiratory distress. He has no wheezes. He has no rales. He exhibits no tenderness.  Abdominal: Soft. Bowel sounds are normal. He exhibits no distension and no mass. There is no tenderness. There is no rebound and no guarding.  Musculoskeletal: Normal range of motion. He exhibits no edema and no tenderness.       Lumbar back: Normal. He exhibits normal range of motion, no tenderness, no bony tenderness, no swelling, no edema, no deformity, no laceration, no pain, no spasm and normal pulse.  Lymphadenopathy:    He has no cervical adenopathy.  Neurological: He is alert and oriented to person, place, and time. He has normal strength. He displays no atrophy, no tremor and normal reflexes. No cranial nerve deficit or sensory deficit. He exhibits normal muscle tone. He displays a negative Romberg sign. He displays no seizure activity. Coordination and gait normal.  Reflex Scores:      Tricep reflexes are 1+ on the right side and 1+ on the left side.      Bicep reflexes are 1+ on the right side and 1+ on the left side.      Brachioradialis reflexes are 1+ on the right side and 1+ on the left side.      Patellar reflexes are 1+  on the right side and 1+ on the left side.      Achilles reflexes are 1+ on the right side and 1+ on the left side. Neg SLR in BLE  Skin: Skin is warm and dry. No rash noted. He is not diaphoretic. No erythema. No pallor.  Psychiatric: He has a normal mood and affect. His behavior is normal. Judgment and thought  content normal.          Assessment & Plan:

## 2012-12-10 NOTE — Patient Instructions (Signed)

## 2013-04-15 ENCOUNTER — Encounter: Payer: Self-pay | Admitting: Internal Medicine

## 2013-04-15 ENCOUNTER — Other Ambulatory Visit: Payer: Self-pay

## 2013-04-15 ENCOUNTER — Ambulatory Visit (INDEPENDENT_AMBULATORY_CARE_PROVIDER_SITE_OTHER): Payer: BC Managed Care – PPO | Admitting: Internal Medicine

## 2013-04-15 VITALS — BP 120/70 | HR 58 | Temp 98.1°F | Resp 16 | Ht 69.0 in | Wt 241.0 lb

## 2013-04-15 DIAGNOSIS — M2340 Loose body in knee, unspecified knee: Secondary | ICD-10-CM | POA: Insufficient documentation

## 2013-04-15 DIAGNOSIS — N529 Male erectile dysfunction, unspecified: Secondary | ICD-10-CM

## 2013-04-15 DIAGNOSIS — M2341 Loose body in knee, right knee: Secondary | ICD-10-CM

## 2013-04-15 MED ORDER — VARDENAFIL HCL 20 MG PO TABS: 20.0000 mg | ORAL_TABLET | Freq: Every day | ORAL | Status: DC | PRN

## 2013-04-15 NOTE — Progress Notes (Signed)
Pre visit review using our clinic review tool, if applicable. No additional management support is needed unless otherwise documented below in the visit note. 

## 2013-04-15 NOTE — Assessment & Plan Note (Signed)
He will try levitra for this 

## 2013-04-15 NOTE — Patient Instructions (Signed)

## 2013-04-15 NOTE — Progress Notes (Signed)
Subjective:    Patient ID: Christopher Byrd, male    DOB: [birth year], 42 y.o.   MRN: [id number]  Knee Pain  The incident occurred more than 1 week ago. Incident location: in active duty. The pain is present in the right knee. The quality of the pain is described as aching. The pain is at a severity of 2/10. The pain is mild. The pain has been worsening since onset. Pertinent negatives include no inability to bear weight, loss of motion, loss of sensation, muscle weakness, numbness or tingling. The symptoms are aggravated by movement and palpation. He has tried NSAIDs for the symptoms. The treatment provided moderate relief.  Erectile Dysfunction This is a chronic problem. The current episode started more than 1 year ago. The problem is unchanged. The nature of his difficulty is achieving erection, maintaining erection and penetration. He reports no anxiety, decreased libido or performance anxiety. He reports his erection duration to be 1 to 5 minutes. Irritative symptoms do not include frequency, nocturia or urgency. Obstructive symptoms do not include dribbling, incomplete emptying, an intermittent stream, a slower stream, straining or a weak stream. Pertinent negatives include no chills, dysuria, genital pain, hematuria, hesitancy or inability to urinate. Nothing aggravates the symptoms. Past treatments include tadalafil. The treatment provided mild relief. He has had no adverse reactions caused by medications.      Review of Systems  Constitutional: Negative.  Negative for fever, chills, diaphoresis, appetite change and fatigue.  HENT: Negative.   Eyes: Negative.   Respiratory: Negative.  Negative for cough, chest tightness, shortness of breath, wheezing and stridor.   Cardiovascular: Negative.  Negative for chest pain, palpitations and leg swelling.  Gastrointestinal: Negative.  Negative for nausea, vomiting, abdominal pain, diarrhea, constipation and blood in stool.  Endocrine: Negative.    Genitourinary: Negative.  Negative for dysuria, hesitancy, urgency, frequency, hematuria, decreased libido, incomplete emptying and nocturia.  Musculoskeletal: Positive for arthralgias. Negative for back pain, gait problem, joint swelling, myalgias, neck pain and neck stiffness.  Skin: Negative.   Allergic/Immunologic: Negative.   Neurological: Negative.  Negative for tingling and numbness.  Hematological: Negative.  Negative for adenopathy. Does not bruise/bleed easily.  Psychiatric/Behavioral: Negative.        Objective:   Physical Exam  Vitals reviewed. Constitutional: He is oriented to person, place, and time. He appears well-developed and well-nourished. No distress.  HENT:  Head: Normocephalic and atraumatic.  Mouth/Throat: Oropharynx is clear and moist. No oropharyngeal exudate.  Eyes: Conjunctivae are normal. Right eye exhibits no discharge. Left eye exhibits no discharge. No scleral icterus.  Neck: Normal range of motion. Neck supple. No JVD present. No tracheal deviation present. No thyromegaly present.  Cardiovascular: Normal rate, regular rhythm, normal heart sounds and intact distal pulses.  Exam reveals no gallop and no friction rub.   No murmur heard. Pulmonary/Chest: Effort normal and breath sounds normal. No stridor. No respiratory distress. He has no wheezes. He has no rales. He exhibits no tenderness.  Abdominal: Soft. Bowel sounds are normal. He exhibits no distension and no mass. There is no tenderness. There is no rebound and no guarding.  Musculoskeletal: Normal range of motion. He exhibits no edema and no tenderness.       Right knee: Normal. He exhibits normal range of motion, no swelling, no effusion, no ecchymosis, no deformity, no laceration, no erythema, normal alignment, no LCL laxity, normal patellar mobility and no bony tenderness. No tenderness found.  Lymphadenopathy:    He has no cervical adenopathy.  Neurological: He is oriented to person, place, and  time.  Skin: Skin is warm and dry. No rash noted. He is not diaphoretic. No erythema. No pallor.  Psychiatric: He has a normal mood and affect. His behavior is normal. Judgment and thought content normal.     Lab Results  Component Value Date   WBC 5.9 05/11/2012   HGB 15.1 05/11/2012   HCT 45.1 05/11/2012   PLT 202.0 05/11/2012   GLUCOSE 95 05/11/2012   CHOL 144 05/11/2012   TRIG 83.0 05/11/2012   HDL 42.90 05/11/2012   LDLCALC 85 05/11/2012   ALT 42 05/11/2012   AST 29 05/11/2012   NA 139 05/11/2012   K 4.4 05/11/2012   CL 103 05/11/2012   CREATININE 1.1 05/11/2012   BUN 14 05/11/2012   CO2 29 05/11/2012   TSH 1.02 05/11/2012   PSA 0.27 05/11/2012       Assessment & Plan:

## 2013-04-15 NOTE — Assessment & Plan Note (Signed)
Ortho referral  

## 2013-11-18 ENCOUNTER — Other Ambulatory Visit: Payer: Self-pay | Admitting: Internal Medicine

## 2014-01-12 ENCOUNTER — Encounter: Payer: Self-pay | Admitting: Internal Medicine

## 2014-01-12 ENCOUNTER — Ambulatory Visit (INDEPENDENT_AMBULATORY_CARE_PROVIDER_SITE_OTHER): Payer: BC Managed Care – PPO | Admitting: Internal Medicine

## 2014-01-12 VITALS — BP 118/72 | HR 56 | Temp 98.5°F | Resp 16 | Ht 69.0 in | Wt 244.0 lb

## 2014-01-12 DIAGNOSIS — E7800 Pure hypercholesterolemia, unspecified: Secondary | ICD-10-CM

## 2014-01-12 DIAGNOSIS — Z0000 Encounter for general adult medical examination without abnormal findings: Secondary | ICD-10-CM

## 2014-01-12 NOTE — Patient Instructions (Signed)

## 2014-01-12 NOTE — Progress Notes (Signed)
Subjective:    Patient ID: Christopher Byrd, male    DOB: 18-Mar-1971, 43 y.o.   MRN: [id number]  Hyperlipidemia This is a chronic problem. The current episode started more than 1 year ago. The problem is controlled. Recent lipid tests were reviewed and are variable. He has no history of chronic renal disease, diabetes, hypothyroidism, liver disease, obesity or nephrotic syndrome. There are no known factors aggravating his hyperlipidemia. Pertinent negatives include no chest pain, focal sensory loss, focal weakness, leg pain, myalgias or shortness of breath. Current antihyperlipidemic treatment includes statins. The current treatment provides moderate improvement of lipids.      Review of Systems  Constitutional: Negative.   HENT: Negative.   Eyes: Negative.   Respiratory: Negative.  Negative for shortness of breath.   Cardiovascular: Negative.  Negative for chest pain, palpitations and leg swelling.  Gastrointestinal: Negative.  Negative for nausea, vomiting, abdominal pain, diarrhea, constipation and blood in stool.  Endocrine: Negative.   Genitourinary: Negative.   Musculoskeletal: Negative.  Negative for arthralgias, back pain and myalgias.  Skin: Negative.  Negative for rash.  Allergic/Immunologic: Negative.   Neurological: Negative.  Negative for focal weakness.  Hematological: Negative.  Negative for adenopathy. Does not bruise/bleed easily.  Psychiatric/Behavioral: Negative.        Objective:   Physical Exam  Vitals reviewed. Constitutional: He is oriented to person, place, and time. He appears well-developed and well-nourished. No distress.  HENT:  Head: Normocephalic and atraumatic.  Mouth/Throat: Oropharynx is clear and moist. No oropharyngeal exudate.  Eyes: Conjunctivae are normal. Right eye exhibits no discharge. Left eye exhibits no discharge. No scleral icterus.  Neck: Normal range of motion. Neck supple. No JVD present. No tracheal deviation present. No  thyromegaly present.  Cardiovascular: Normal rate, regular rhythm, normal heart sounds and intact distal pulses.  Exam reveals no gallop and no friction rub.   No murmur heard. Pulmonary/Chest: Effort normal and breath sounds normal. No stridor. No respiratory distress. He has no wheezes. He has no rales. He exhibits no tenderness.  Abdominal: Soft. Bowel sounds are normal. He exhibits no distension and no mass. There is no tenderness. There is no rebound and no guarding. Hernia confirmed negative in the right inguinal area and confirmed negative in the left inguinal area.  Genitourinary: Rectum normal, prostate normal, testes normal and penis normal. Rectal exam shows no external hemorrhoid, no internal hemorrhoid, no fissure, no mass, no tenderness and anal tone normal. Guaiac negative stool. Prostate is not enlarged and not tender. Right testis shows no mass, no swelling and no tenderness. Right testis is descended. Left testis shows no mass, no swelling and no tenderness. Left testis is descended. Circumcised. No penile erythema or penile tenderness. No discharge found.  Lymphadenopathy:    He has no cervical adenopathy.       Right: No inguinal adenopathy present.       Left: No inguinal adenopathy present.  Neurological: He is oriented to person, place, and time.  Skin: Skin is warm and dry. No rash noted. He is not diaphoretic. No erythema. No pallor.  Psychiatric: He has a normal mood and affect. His behavior is normal. Judgment and thought content normal.     Lab Results  Component Value Date   WBC 5.9 05/11/2012   HGB 15.1 05/11/2012   HCT 45.1 05/11/2012   PLT 202.0 05/11/2012   GLUCOSE 95 05/11/2012   CHOL 144 05/11/2012   TRIG 83.0 05/11/2012   HDL 42.90 05/11/2012  LDLCALC 85 05/11/2012   ALT 42 05/11/2012   AST 29 05/11/2012   NA 139 05/11/2012   K 4.4 05/11/2012   CL 103 05/11/2012   CREATININE 1.1 05/11/2012   BUN 14 05/11/2012   CO2 29 05/11/2012   TSH 1.02 05/11/2012   PSA 0.27  05/11/2012       Assessment & Plan:

## 2014-01-16 ENCOUNTER — Encounter: Payer: Self-pay | Admitting: Internal Medicine

## 2014-01-16 NOTE — Assessment & Plan Note (Signed)
He is doing well on the statin I will recheck his FLP today

## 2014-01-16 NOTE — Assessment & Plan Note (Signed)
Exam done Vaccines were reviewed Labs ordered Pt ed material was given 

## 2014-05-16 ENCOUNTER — Other Ambulatory Visit: Payer: Self-pay | Admitting: Internal Medicine

## 2014-09-09 ENCOUNTER — Ambulatory Visit (INDEPENDENT_AMBULATORY_CARE_PROVIDER_SITE_OTHER): Payer: BLUE CROSS/BLUE SHIELD | Admitting: Internal Medicine

## 2014-09-09 ENCOUNTER — Ambulatory Visit (INDEPENDENT_AMBULATORY_CARE_PROVIDER_SITE_OTHER): Payer: BLUE CROSS/BLUE SHIELD

## 2014-09-09 VITALS — BP 118/62 | HR 67 | Temp 98.4°F | Resp 18 | Ht 69.0 in | Wt 254.2 lb

## 2014-09-09 DIAGNOSIS — M722 Plantar fascial fibromatosis: Secondary | ICD-10-CM

## 2014-09-09 DIAGNOSIS — M25571 Pain in right ankle and joints of right foot: Secondary | ICD-10-CM

## 2014-09-09 MED ORDER — PREDNISONE 10 MG PO TABS: ORAL_TABLET | ORAL | Status: DC

## 2014-09-09 NOTE — Progress Notes (Signed)
   Subjective:    Patient ID: Christopher Byrd, male    DOB: 1971/04/02, 44 y.o.   MRN: [id number]  HPI I am Alfredia Ferguson CMA, AAMA scribing for Dr Asencion Partridge. Patient Christopher Byrd is complaining of plantar fascitis of right. Xray right heel, he was a avvid runner, he was in [place] and walking felt a stabbing pain in foot. The pain Is in right heel, worse after sleeping or resting, no specific trauma. Hx of similar pain the past. No red or heat present. No weakness or numbness.    Review of Systems     Objective:   Physical Exam  Constitutional: He is oriented to person, place, and time. He appears well-developed and well-nourished. No distress.  HENT:  Head: Normocephalic.  Eyes: EOM are normal.  Pulmonary/Chest: Effort normal.  Musculoskeletal: He exhibits tenderness.       Right foot: There is tenderness and bony tenderness. There is normal range of motion, no swelling, normal capillary refill, no crepitus, no deformity and no laceration.       Feet:  Neurological: He is alert and oriented to person, place, and time. No cranial nerve deficit or sensory deficit. He exhibits normal muscle tone. Gait abnormal. Coordination normal.  Skin: No rash noted. No erythema.  Psychiatric: He has a normal mood and affect.  Vitals reviewed.  [facility] reading (PRIMARY) by  Dr.Finas Delone normal calcaneus, no fx, no spur.         Assessment & Plan:  Plantar Fasciitis/RICE Camwalker Prednisone taper Tylenol

## 2014-09-09 NOTE — Patient Instructions (Signed)
Plantar Fasciitis (Heel Spur Syndrome) with Rehab The plantar fascia is a fibrous, ligament-like, soft-tissue structure that spans the bottom of the foot. Plantar fasciitis is a condition that causes pain in the foot due to inflammation of the tissue. SYMPTOMS   Pain and tenderness on the underneath side of the foot.  Pain that worsens with standing or walking. CAUSES  Plantar fasciitis is caused by irritation and injury to the plantar fascia on the underneath side of the foot. Common mechanisms of injury include:  Direct trauma to bottom of the foot.  Damage to a small nerve that runs under the foot where the main fascia attaches to the heel bone.  Stress placed on the plantar fascia due to bone spurs. RISK INCREASES WITH:   Activities that place stress on the plantar fascia (running, jumping, pivoting, or cutting).  Poor strength and flexibility.  Improperly fitted shoes.  Tight calf muscles.  Flat feet.  Failure to warm-up properly before activity.  Obesity. PREVENTION  Warm up and stretch properly before activity.  Allow for adequate recovery between workouts.  Maintain physical fitness:  Strength, flexibility, and endurance.  Cardiovascular fitness.  Maintain a health body weight.  Avoid stress on the plantar fascia.  Wear properly fitted shoes, including arch supports for individuals who have flat feet. PROGNOSIS  If treated properly, then the symptoms of plantar fasciitis usually resolve without surgery. However, occasionally surgery is necessary. RELATED COMPLICATIONS   Recurrent symptoms that may result in a chronic condition.  Problems of the lower back that are caused by compensating for the injury, such as limping.  Pain or weakness of the foot during push-off following surgery.  Chronic inflammation, scarring, and partial or complete fascia tear, occurring more often from repeated injections. TREATMENT  Treatment initially involves the use of  ice and medication to help reduce pain and inflammation. The use of strengthening and stretching exercises may help reduce pain with activity, especially stretches of the Achilles tendon. These exercises may be performed at home or with a therapist. Your caregiver may recommend that you use heel cups of arch supports to help reduce stress on the plantar fascia. Occasionally, corticosteroid injections are given to reduce inflammation. If symptoms persist for greater than 6 months despite non-surgical (conservative), then surgery may be recommended.  MEDICATION   If pain medication is necessary, then nonsteroidal anti-inflammatory medications, such as aspirin and ibuprofen, or other minor pain relievers, such as acetaminophen, are often recommended.  Do not take pain medication within 7 days before surgery.  Prescription pain relievers may be given if deemed necessary by your caregiver. Use only as directed and only as much as you need.  Corticosteroid injections may be given by your caregiver. These injections should be reserved for the most serious cases, because they may only be given a certain number of times. HEAT AND COLD  Cold treatment (icing) relieves pain and reduces inflammation. Cold treatment should be applied for 10 to 15 minutes every 2 to 3 hours for inflammation and pain and immediately after any activity that aggravates your symptoms. Use ice packs or massage the area with a piece of ice (ice massage).  Heat treatment may be used prior to performing the stretching and strengthening activities prescribed by your caregiver, physical therapist, or athletic trainer. Use a heat pack or soak the injury in warm water. SEEK IMMEDIATE MEDICAL CARE IF:  Treatment seems to offer no benefit, or the condition worsens.  Any medications produce adverse side effects. EXERCISES RANGE   OF MOTION (ROM) AND STRETCHING EXERCISES - Plantar Fasciitis (Heel Spur Syndrome) These exercises may help you  when beginning to rehabilitate your injury. Your symptoms may resolve with or without further involvement from your physician, physical therapist or athletic trainer. While completing these exercises, remember:   Restoring tissue flexibility helps normal motion to return to the joints. This allows healthier, less painful movement and activity.  An effective stretch should be held for at least 30 seconds.  A stretch should never be painful. You should only feel a gentle lengthening or release in the stretched tissue. RANGE OF MOTION - Toe Extension, Flexion  Sit with your right / left leg crossed over your opposite knee.  Grasp your toes and gently pull them back toward the top of your foot. You should feel a stretch on the bottom of your toes and/or foot.  Hold this stretch for __________ seconds.  Now, gently pull your toes toward the bottom of your foot. You should feel a stretch on the top of your toes and or foot.  Hold this stretch for __________ seconds. Repeat __________ times. Complete this stretch __________ times per day.  RANGE OF MOTION - Ankle Dorsiflexion, Active Assisted  Remove shoes and sit on a chair that is preferably not on a carpeted surface.  Place right / left foot under knee. Extend your opposite leg for support.  Keeping your heel down, slide your right / left foot back toward the chair until you feel a stretch at your ankle or calf. If you do not feel a stretch, slide your bottom forward to the edge of the chair, while still keeping your heel down.  Hold this stretch for __________ seconds. Repeat __________ times. Complete this stretch __________ times per day.  STRETCH - Gastroc, Standing  Place hands on wall.  Extend right / left leg, keeping the front knee somewhat bent.  Slightly point your toes inward on your back foot.  Keeping your right / left heel on the floor and your knee straight, shift your weight toward the wall, not allowing your back to  arch.  You should feel a gentle stretch in the right / left calf. Hold this position for __________ seconds. Repeat __________ times. Complete this stretch __________ times per day. STRETCH - Soleus, Standing  Place hands on wall.  Extend right / left leg, keeping the other knee somewhat bent.  Slightly point your toes inward on your back foot.  Keep your right / left heel on the floor, bend your back knee, and slightly shift your weight over the back leg so that you feel a gentle stretch deep in your back calf.  Hold this position for __________ seconds. Repeat __________ times. Complete this stretch __________ times per day. STRETCH - Gastrocsoleus, Standing  Note: This exercise can place a lot of stress on your foot and ankle. Please complete this exercise only if specifically instructed by your caregiver.   Place the ball of your right / left foot on a step, keeping your other foot firmly on the same step.  Hold on to the wall or a rail for balance.  Slowly lift your other foot, allowing your body weight to press your heel down over the edge of the step.  You should feel a stretch in your right / left calf.  Hold this position for __________ seconds.  Repeat this exercise with a slight bend in your right / left knee. Repeat __________ times. Complete this stretch __________ times per day.    STRENGTHENING EXERCISES - Plantar Fasciitis (Heel Spur Syndrome)  These exercises may help you when beginning to rehabilitate your injury. They may resolve your symptoms with or without further involvement from your physician, physical therapist or athletic trainer. While completing these exercises, remember:   Muscles can gain both the endurance and the strength needed for everyday activities through controlled exercises.  Complete these exercises as instructed by your physician, physical therapist or athletic trainer. Progress the resistance and repetitions only as guided. STRENGTH -  Towel Curls  Sit in a chair positioned on a non-carpeted surface.  Place your foot on a towel, keeping your heel on the floor.  Pull the towel toward your heel by only curling your toes. Keep your heel on the floor.  If instructed by your physician, physical therapist or athletic trainer, add ____________________ at the end of the towel. Repeat __________ times. Complete this exercise __________ times per day. STRENGTH - Ankle Inversion  Secure one end of a rubber exercise band/tubing to a fixed object (table, pole). Loop the other end around your foot just before your toes.  Place your fists between your knees. This will focus your strengthening at your ankle.  Slowly, pull your big toe up and in, making sure the band/tubing is positioned to resist the entire motion.  Hold this position for __________ seconds.  Have your muscles resist the band/tubing as it slowly pulls your foot back to the starting position. Repeat __________ times. Complete this exercises __________ times per day.  Document Released: 05/27/2005 Document Revised: 08/19/2011 Document Reviewed: 09/08/2008 [employer] Patient Information 2015 [employer]. This information is not intended to replace advice given to you by your health care provider. Make sure you discuss any questions you have with your health care provider.  

## 2014-11-15 ENCOUNTER — Other Ambulatory Visit: Payer: Self-pay

## 2014-11-15 MED ORDER — ATORVASTATIN CALCIUM 40 MG PO TABS: ORAL_TABLET | ORAL | Status: DC

## 2015-05-15 ENCOUNTER — Other Ambulatory Visit: Payer: Self-pay

## 2015-05-15 MED ORDER — ATORVASTATIN CALCIUM 40 MG PO TABS: ORAL_TABLET | ORAL | Status: DC

## 2015-05-31 ENCOUNTER — Telehealth: Payer: Self-pay

## 2015-05-31 NOTE — Telephone Encounter (Signed)
Patient left voicemail today at [phone] asking for [place] to call back at [phone]. Did not leave any details.

## 2015-06-01 NOTE — Telephone Encounter (Signed)
Left message for patient to call back  

## 2015-06-02 NOTE — Telephone Encounter (Signed)
Spoke with patient. He no longer needs medical records to process anything for him.

## 2015-11-20 ENCOUNTER — Other Ambulatory Visit: Payer: Self-pay | Admitting: Internal Medicine

## 2015-12-04 ENCOUNTER — Ambulatory Visit (INDEPENDENT_AMBULATORY_CARE_PROVIDER_SITE_OTHER): Payer: BLUE CROSS/BLUE SHIELD | Admitting: Internal Medicine

## 2015-12-04 ENCOUNTER — Ambulatory Visit (INDEPENDENT_AMBULATORY_CARE_PROVIDER_SITE_OTHER)
Admission: RE | Admit: 2015-12-04 | Discharge: 2015-12-04 | Disposition: A | Discharge status: Home / Self Care | Payer: BLUE CROSS/BLUE SHIELD | Source: Ambulatory Visit | Attending: Internal Medicine | Admitting: Internal Medicine

## 2015-12-04 ENCOUNTER — Encounter: Payer: Self-pay | Admitting: Internal Medicine

## 2015-12-04 VITALS — BP 104/64 | HR 60 | Temp 98.2°F | Resp 16 | Ht 69.0 in | Wt 256.0 lb

## 2015-12-04 DIAGNOSIS — M5136 Other intervertebral disc degeneration, lumbar region: Secondary | ICD-10-CM

## 2015-12-04 DIAGNOSIS — M5442 Lumbago with sciatica, left side: Secondary | ICD-10-CM

## 2015-12-04 MED ORDER — ATORVASTATIN CALCIUM 40 MG PO TABS: 40.0000 mg | ORAL_TABLET | Freq: Every day | ORAL | Status: DC

## 2015-12-04 MED ORDER — TRAMADOL HCL 50 MG PO TABS: 50.0000 mg | ORAL_TABLET | Freq: Four times a day (QID) | ORAL | Status: DC | PRN

## 2015-12-04 NOTE — Patient Instructions (Signed)

## 2015-12-04 NOTE — Progress Notes (Signed)
Pre visit review using our clinic review tool, if applicable. No additional management support is needed unless otherwise documented below in the visit note. 

## 2015-12-05 ENCOUNTER — Encounter: Payer: Self-pay | Admitting: Internal Medicine

## 2015-12-05 ENCOUNTER — Telehealth: Payer: Self-pay

## 2015-12-05 NOTE — Telephone Encounter (Signed)
Patient called and wanted a referral to a orthopedic doctor. Can you please follow up, Thank you.

## 2015-12-05 NOTE — Progress Notes (Signed)
Subjective:  Patient ID: Christopher Byrd, male    DOB: 12/28/1970  Age: 45 y.o. MRN: [id number]  CC: Back Pain   HPI Christopher Byrd presents for the complaint of worsening low back pain over the last 3-4 years. He tells me that about 4 months ago he was seen at a [place] and was given an epidural steroid shot in his lumbar spine for what he was told is degenerative disc disease. He states over the last few months the back pain has worsened and now radiates into the left lower extremity. He describes a sensation of weakness, numbness, tingling, intermittently in his left lower extremity. The back pain is sharp and intermittent. He gets some symptom relief with naproxen but requests something in addition to that for control of the back pain.  Outpatient Prescriptions Prior to Visit  Medication Sig Dispense Refill  . fluticasone (FLONASE) 50 MCG/ACT nasal spray     . loratadine (CLARITIN) 10 MG tablet Take 10 mg by mouth daily.    . naproxen (NAPROSYN) 375 MG tablet     . omeprazole (PRILOSEC) 20 MG capsule Take 20 mg by mouth daily.    Marland Kitchen atorvastatin (LIPITOR) 40 MG tablet TAKE 1 TABLET (40 MG TOTAL) BY MOUTH DAILY. 90 tablet 1  . LEVITRA 20 MG tablet TAKE 1 TABLET (20 MG TOTAL) BY MOUTH DAILY AS NEEDED FOR ERECTILE DYSFUNCTION. 8 tablet 10  . predniSONE (DELTASONE) 10 MG tablet 6-5-4-3-2-1- po pc gor plantar fasciitis 21 tablet 0  . traMADol (ULTRAM) 50 MG tablet Take 1 tablet (50 mg total) by mouth every 8 (eight) hours as needed for pain. 30 tablet 0   No facility-administered medications prior to visit.    ROS Review of Systems  Constitutional: Negative for fever, chills and fatigue.  HENT: Negative.   Eyes: Negative.   Respiratory: Negative.  Negative for cough, choking, shortness of breath and stridor.   Cardiovascular: Negative.  Negative for chest pain, palpitations and leg swelling.  Gastrointestinal: Negative.  Negative for nausea, vomiting, abdominal pain, diarrhea and  constipation.  Endocrine: Negative.   Genitourinary: Negative.  Negative for enuresis and difficulty urinating.  Musculoskeletal: Positive for back pain. Negative for myalgias, joint swelling, arthralgias and neck pain.  Skin: Negative.  Negative for rash.  Allergic/Immunologic: Negative.   Neurological: Positive for weakness and numbness. Negative for dizziness, tremors, syncope and light-headedness.  Hematological: Negative.  Negative for adenopathy. Does not bruise/bleed easily.  Psychiatric/Behavioral: Negative.     Objective:  BP 104/64 mmHg  Pulse 60  Temp(Src) 98.2 F (36.8 C) (Oral)  Resp 16  Ht 5\' 9"  (1.753 m)  Wt 256 lb (116.121 kg)  BMI 37.79 kg/m2  SpO2 97%  BP Readings from Last 3 Encounters:  12/04/15 104/64  09/09/14 118/62  01/12/14 118/72    Wt Readings from Last 3 Encounters:  12/04/15 256 lb (116.121 kg)  09/09/14 254 lb 3.2 oz (115.304 kg)  01/12/14 244 lb (110.678 kg)    Physical Exam  Constitutional: He is oriented to person, place, and time. He appears well-developed and well-nourished.  Non-toxic appearance. He does not have a sickly appearance. He does not appear ill. No distress.  HENT:  Mouth/Throat: Oropharynx is clear and moist. No oropharyngeal exudate.  Eyes: Conjunctivae are normal. Right eye exhibits no discharge. Left eye exhibits no discharge. No scleral icterus.  Neck: Normal range of motion. Neck supple. No JVD present. No tracheal deviation present. No thyromegaly present.  Cardiovascular: Normal rate, regular  rhythm, normal heart sounds and intact distal pulses.  Exam reveals no gallop and no friction rub.   No murmur heard. Pulmonary/Chest: Effort normal and breath sounds normal. No stridor. No respiratory distress. He has no wheezes. He has no rales. He exhibits no tenderness.  Abdominal: Soft. Bowel sounds are normal. He exhibits no distension and no mass. There is no tenderness. There is no rebound and no guarding.    Musculoskeletal: Normal range of motion. He exhibits no edema or tenderness.       Lumbar back: Normal. He exhibits normal range of motion, no tenderness, no bony tenderness, no swelling, no edema, no deformity, no laceration, no pain, no spasm and normal pulse.  Lymphadenopathy:    He has no cervical adenopathy.  Neurological: He is alert and oriented to person, place, and time. He has normal strength. He displays no atrophy, no tremor and normal reflexes. No cranial nerve deficit or sensory deficit. He exhibits normal muscle tone. He displays a negative Romberg sign. He displays no seizure activity. Coordination and gait normal.  Reflex Scores:      Tricep reflexes are 1+ on the right side and 1+ on the left side.      Bicep reflexes are 1+ on the right side and 1+ on the left side.      Brachioradialis reflexes are 1+ on the right side and 1+ on the left side.      Patellar reflexes are 2+ on the right side and 2+ on the left side.      Achilles reflexes are 1+ on the right side and 0 on the left side. Neg SLR in BLE  Skin: Skin is warm and dry. No rash noted. He is not diaphoretic. No erythema. No pallor.  Vitals reviewed.   Lab Results  Component Value Date   WBC 5.9 05/11/2012   HGB 15.1 05/11/2012   HCT 45.1 05/11/2012   PLT 202.0 05/11/2012   GLUCOSE 95 05/11/2012   CHOL 144 05/11/2012   TRIG 83.0 05/11/2012   HDL 42.90 05/11/2012   LDLCALC 85 05/11/2012   ALT 42 05/11/2012   AST 29 05/11/2012   NA 139 05/11/2012   K 4.4 05/11/2012   CL 103 05/11/2012   CREATININE 1.1 05/11/2012   BUN 14 05/11/2012   CO2 29 05/11/2012   TSH 1.02 05/11/2012   PSA 0.27 05/11/2012    No results found.  Assessment & Plan:   Christopher Byrd was seen today for back pain.  Diagnoses and all orders for this visit:  Left-sided low back pain with left-sided sciatica- he complains of radicular symptoms in his left lower extremity but his neurologic exam today does not show any abnormal findings,  he will continue taking the current anti-inflammatory and I will add tramadol for additional symptom relief. He wants a second opinion from a pain specialist so I have sent a referral. -     Ambulatory referral to Pain Clinic -     DG Lumbar Spine Complete; Future -     traMADol (ULTRAM) 50 MG tablet; Take 1 tablet (50 mg total) by mouth every 6 (six) hours as needed.  DDD (degenerative disc disease), lumbar- as above -     Ambulatory referral to Pain Clinic -     DG Lumbar Spine Complete; Future -     traMADol (ULTRAM) 50 MG tablet; Take 1 tablet (50 mg total) by mouth every 6 (six) hours as needed.  Other orders -  atorvastatin (LIPITOR) 40 MG tablet; Take 1 tablet (40 mg total) by mouth daily. TAKE 1 TABLET (40 MG TOTAL) BY MOUTH DAILY.   I have discontinued Mr. Falkowitz traMADol, LEVITRA, and predniSONE. I have also changed his atorvastatin. Additionally, I am having him start on traMADol. Lastly, I am having him maintain his naproxen, fluticasone, omeprazole, loratadine, and sertraline.  Meds ordered this encounter  Medications  . sertraline (ZOLOFT) 100 MG tablet    Sig: 100 mg.  . atorvastatin (LIPITOR) 40 MG tablet    Sig: Take 1 tablet (40 mg total) by mouth daily. TAKE 1 TABLET (40 MG TOTAL) BY MOUTH DAILY.    Dispense:  90 tablet    Refill:  1  . traMADol (ULTRAM) 50 MG tablet    Sig: Take 1 tablet (50 mg total) by mouth every 6 (six) hours as needed.    Dispense:  65 tablet    Refill:  1     Follow-up: Return in about 6 weeks (around 01/15/2016).  Scarlette Calico, MD

## 2015-12-05 NOTE — Telephone Encounter (Signed)
I ordered a referral yesterday

## 2016-01-03 ENCOUNTER — Other Ambulatory Visit (INDEPENDENT_AMBULATORY_CARE_PROVIDER_SITE_OTHER): Payer: BLUE CROSS/BLUE SHIELD

## 2016-01-03 ENCOUNTER — Ambulatory Visit (INDEPENDENT_AMBULATORY_CARE_PROVIDER_SITE_OTHER): Payer: BLUE CROSS/BLUE SHIELD | Admitting: Internal Medicine

## 2016-01-03 ENCOUNTER — Encounter: Payer: Self-pay | Admitting: Internal Medicine

## 2016-01-03 VITALS — BP 110/74 | HR 59 | Temp 98.3°F | Resp 16 | Ht 69.0 in | Wt 256.2 lb

## 2016-01-03 DIAGNOSIS — M5442 Lumbago with sciatica, left side: Secondary | ICD-10-CM | POA: Diagnosis not present

## 2016-01-03 DIAGNOSIS — M545 Low back pain: Secondary | ICD-10-CM

## 2016-01-03 DIAGNOSIS — Z0000 Encounter for general adult medical examination without abnormal findings: Secondary | ICD-10-CM

## 2016-01-03 DIAGNOSIS — M5136 Other intervertebral disc degeneration, lumbar region: Secondary | ICD-10-CM | POA: Diagnosis not present

## 2016-01-03 LAB — CBC WITH DIFFERENTIAL/PLATELET
Basophils Absolute: 0 10*3/uL (ref 0.0–0.1)
Basophils Relative: 0.5 % (ref 0.0–3.0)
EOS PCT: 0.5 % (ref 0.0–5.0)
Eosinophils Absolute: 0 10*3/uL (ref 0.0–0.7)
HEMATOCRIT: 44.7 % (ref 39.0–52.0)
HEMOGLOBIN: 15.2 g/dL (ref 13.0–17.0)
LYMPHS PCT: 19.5 % (ref 12.0–46.0)
Lymphs Abs: 1.2 10*3/uL (ref 0.7–4.0)
MCHC: 34 g/dL (ref 30.0–36.0)
MCV: 86.7 fl (ref 78.0–100.0)
MONO ABS: 0.4 10*3/uL (ref 0.1–1.0)
MONOS PCT: 6.6 % (ref 3.0–12.0)
Neutro Abs: 4.5 10*3/uL (ref 1.4–7.7)
Neutrophils Relative %: 72.9 % (ref 43.0–77.0)
Platelets: 190 10*3/uL (ref 150.0–400.0)
RBC: 5.16 Mil/uL (ref 4.22–5.81)
RDW: 14.1 % (ref 11.5–15.5)
WBC: 6.2 10*3/uL (ref 4.0–10.5)

## 2016-01-03 LAB — PSA: PSA: 0.22 ng/mL (ref 0.10–4.00)

## 2016-01-03 LAB — COMPREHENSIVE METABOLIC PANEL
ALBUMIN: 4.4 g/dL (ref 3.5–5.2)
ALK PHOS: 105 U/L (ref 39–117)
ALT: 57 U/L — ABNORMAL HIGH (ref 0–53)
AST: 46 U/L — ABNORMAL HIGH (ref 0–37)
BUN: 19 mg/dL (ref 6–23)
CALCIUM: 9.5 mg/dL (ref 8.4–10.5)
CO2: 28 mEq/L (ref 19–32)
CREATININE: 1.12 mg/dL (ref 0.40–1.50)
Chloride: 105 mEq/L (ref 96–112)
GFR: 90.43 mL/min (ref >60.00)
Glucose, Bld: 86 mg/dL (ref 70–99)
POTASSIUM: 4.7 meq/L (ref 3.5–5.1)
SODIUM: 140 meq/L (ref 135–145)
TOTAL PROTEIN: 7 g/dL (ref 6.0–8.3)
Total Bilirubin: 1.4 mg/dL — ABNORMAL HIGH (ref 0.2–1.2)

## 2016-01-03 LAB — LIPID PANEL
CHOLESTEROL: 157 mg/dL (ref 0–200)
HDL: 41.5 mg/dL (ref >39.00)
LDL Cholesterol: 94 mg/dL (ref 0–99)
NONHDL: 115.61
Total CHOL/HDL Ratio: 4
Triglycerides: 108 mg/dL (ref 0.0–149.0)
VLDL: 21.6 mg/dL (ref 0.0–40.0)

## 2016-01-03 LAB — FECAL OCCULT BLOOD, GUAIAC: FECAL OCCULT BLD: NEGATIVE

## 2016-01-03 LAB — TSH: TSH: 1.04 u[IU]/mL (ref 0.35–4.50)

## 2016-01-03 MED ORDER — TRAMADOL HCL 50 MG PO TABS: 50.0000 mg | ORAL_TABLET | Freq: Four times a day (QID) | ORAL | 3 refills | Status: DC | PRN

## 2016-01-03 NOTE — Progress Notes (Signed)
Subjective:  Patient ID: Christopher Byrd, male    DOB: Apr 21, 1971  Age: 45 y.o. MRN: [id number]  CC: Annual Exam    HPI Christopher Byrd presents for a CPX.  He asks for refill on tramadol. He tells me he is going to the [place] in [place] later today to get an epidural steroid shot. He has chronic, unchanged back pain that rarely radiates into his left lower extremities. He denies any paresthesias.  He is doing well on atorvastatin with no abdominal pain or myalgias.  His depression, mood, and anxiety are all well controlled with the current dose of sertraline.      Outpatient Medications Prior to Visit  Medication Sig Dispense Refill  . atorvastatin (LIPITOR) 40 MG tablet Take 1 tablet (40 mg total) by mouth daily. TAKE 1 TABLET (40 MG TOTAL) BY MOUTH DAILY. 90 tablet 1  . fluticasone (FLONASE) 50 MCG/ACT nasal spray     . loratadine (CLARITIN) 10 MG tablet Take 10 mg by mouth daily.    . naproxen (NAPROSYN) 375 MG tablet     . omeprazole (PRILOSEC) 20 MG capsule Take 20 mg by mouth daily.    . sertraline (ZOLOFT) 100 MG tablet 100 mg.    . traMADol (ULTRAM) 50 MG tablet Take 1 tablet (50 mg total) by mouth every 6 (six) hours as needed. 65 tablet 1   No facility-administered medications prior to visit.     ROS Review of Systems  Constitutional: Negative.  Negative for activity change, appetite change, diaphoresis and fatigue.  HENT: Negative for congestion, trouble swallowing and voice change.   Eyes: Negative.   Respiratory: Negative for cough, chest tightness, shortness of breath, wheezing and stridor.   Cardiovascular: Negative for chest pain, palpitations and leg swelling.  Gastrointestinal: Negative.  Negative for abdominal pain, constipation, diarrhea, nausea and vomiting.  Endocrine: Negative.   Genitourinary: Negative.  Negative for difficulty urinating, dysuria, frequency, hematuria, penile swelling, scrotal swelling, testicular pain and urgency.  Musculoskeletal:  Positive for back pain. Negative for arthralgias, myalgias and neck pain.  Skin: Negative.   Allergic/Immunologic: Negative.   Neurological: Negative.  Negative for dizziness.  Hematological: Negative.  Negative for adenopathy. Does not bruise/bleed easily.  Psychiatric/Behavioral: Negative.  Negative for behavioral problems, decreased concentration and sleep disturbance. The patient is not nervous/anxious.     Objective:  BP 110/74 (BP Location: Left Arm, Patient Position: Sitting, Cuff Size: Large)   Pulse (!) 59   Temp 98.3 F (36.8 C) (Oral)   Resp 16   Ht 5\' 9"  (1.753 m)   Wt 256 lb 4 oz (116.2 kg)   SpO2 97%   BMI 37.84 kg/m   BP Readings from Last 3 Encounters:  01/03/16 110/74  12/04/15 104/64  09/09/14 118/62    Wt Readings from Last 3 Encounters:  01/03/16 256 lb 4 oz (116.2 kg)  12/04/15 256 lb (116.1 kg)  09/09/14 254 lb 3.2 oz (115.3 kg)    Physical Exam  Constitutional: He is oriented to person, place, and time. No distress.  HENT:  Mouth/Throat: Oropharynx is clear and moist. No oropharyngeal exudate.  Eyes: Conjunctivae are normal. Right eye exhibits no discharge. Left eye exhibits no discharge. No scleral icterus.  Neck: Normal range of motion. Neck supple. No JVD present. No tracheal deviation present. No thyromegaly present.  Cardiovascular: Normal rate, regular rhythm, normal heart sounds and intact distal pulses.  Exam reveals no gallop and no friction rub.   No murmur heard. Pulmonary/Chest:  Effort normal and breath sounds normal. No stridor. No respiratory distress. He has no wheezes. He has no rales. He exhibits no tenderness.  Abdominal: Soft. Bowel sounds are normal. He exhibits no distension and no mass. There is no tenderness. There is no rebound and no guarding. Hernia confirmed negative in the right inguinal area and confirmed negative in the left inguinal area.  Genitourinary: Rectum normal, testes normal and penis normal. Rectal exam shows no  external hemorrhoid, no internal hemorrhoid, no fissure, no mass, no tenderness, anal tone normal and guaiac negative stool. Prostate is enlarged (1+ smooth symm BPH). Prostate is not tender. Right testis shows no mass, no swelling and no tenderness. Right testis is descended. Left testis shows no mass, no swelling and no tenderness. Left testis is descended. Circumcised. No penile erythema or penile tenderness. No discharge found.  Musculoskeletal: Normal range of motion. He exhibits no edema, tenderness or deformity.  Lymphadenopathy:    He has no cervical adenopathy.       Right: No inguinal adenopathy present.       Left: No inguinal adenopathy present.  Neurological: He is oriented to person, place, and time.  Skin: Skin is warm and dry. No rash noted. He is not diaphoretic. No erythema. No pallor.  Psychiatric: He has a normal mood and affect. His behavior is normal. Judgment and thought content normal.    Lab Results  Component Value Date   WBC 6.2 01/03/2016   HGB 15.2 01/03/2016   HCT 44.7 01/03/2016   PLT 190.0 01/03/2016   GLUCOSE 86 01/03/2016   CHOL 157 01/03/2016   TRIG 108.0 01/03/2016   HDL 41.50 01/03/2016   LDLCALC 94 01/03/2016   ALT 57 (H) 01/03/2016   AST 46 (H) 01/03/2016   NA 140 01/03/2016   K 4.7 01/03/2016   CL 105 01/03/2016   CREATININE 1.12 01/03/2016   BUN 19 01/03/2016   CO2 28 01/03/2016   TSH 1.04 01/03/2016   PSA 0.22 01/03/2016    Dg Lumbar Spine Complete  Result Date: 12/05/2015 CLINICAL DATA:  Back injury. EXAM: LUMBAR SPINE - COMPLETE 4+ VIEW COMPARISON:  12/10/2012. FINDINGS: No acute bony abnormality identified. Degenerative changes lumbar spine. Mild degenerative changes have increased slightly from 12/10/2012 . Mild scoliosis lumbar spine. Stable calcifications noted over the right abdomen. IMPRESSION: Diffuse degenerative change lumbar spine with mild scoliosis. Diffuse degenerative changes have increased slightly from 12/10/2012 . No  acute abnormality . Electronically Signed   By: Marcello Moores  Register   On: 12/05/2015 08:19    Assessment & Plan:   [name] was seen today for annual exam.  Diagnoses and all orders for this visit:  Routine general medical examination at a health care facility- exam completed, labs ordered and reviewed, vaccines reviewed, patient education material was given. -     Lipid panel; Future -     CBC with Differential/Platelet; Future -     Comprehensive metabolic panel; Future -     TSH; Future -     PSA; Future  DDD (degenerative disc disease), lumbar- will continue tramadol for pain relief. -     traMADol (ULTRAM) 50 MG tablet; Take 1 tablet (50 mg total) by mouth every 6 (six) hours as needed for moderate pain.  Low back pain without sciatica, unspecified back pain laterality -     traMADol (ULTRAM) 50 MG tablet; Take 1 tablet (50 mg total) by mouth every 6 (six) hours as needed for moderate pain.  Left-sided low back  pain with left-sided sciatica   I have changed Mr. Standish traMADol. I am also having him maintain his naproxen, fluticasone, omeprazole, loratadine, sertraline, and atorvastatin.  Meds ordered this encounter  Medications  . traMADol (ULTRAM) 50 MG tablet    Sig: Take 1 tablet (50 mg total) by mouth every 6 (six) hours as needed for moderate pain.    Dispense:  65 tablet    Refill:  3     Follow-up: Return if symptoms worsen or fail to improve.  Scarlette Calico, MD

## 2016-01-03 NOTE — Patient Instructions (Signed)

## 2016-01-03 NOTE — Progress Notes (Signed)
Pre visit review using our clinic review tool, if applicable. No additional management support is needed unless otherwise documented below in the visit note. 

## 2016-02-29 ENCOUNTER — Encounter: Payer: BLUE CROSS/BLUE SHIELD | Admitting: Physical Medicine & Rehabilitation

## 2016-05-30 ENCOUNTER — Other Ambulatory Visit: Payer: Self-pay | Admitting: Internal Medicine

## 2016-09-06 ENCOUNTER — Other Ambulatory Visit: Payer: Self-pay | Admitting: Occupational Medicine

## 2016-09-06 ENCOUNTER — Ambulatory Visit: Payer: Self-pay

## 2016-09-06 DIAGNOSIS — M79645 Pain in left finger(s): Secondary | ICD-10-CM

## 2016-12-01 ENCOUNTER — Other Ambulatory Visit: Payer: Self-pay | Admitting: Internal Medicine

## 2017-02-24 ENCOUNTER — Ambulatory Visit (INDEPENDENT_AMBULATORY_CARE_PROVIDER_SITE_OTHER): Payer: BLUE CROSS/BLUE SHIELD | Admitting: Internal Medicine

## 2017-02-24 ENCOUNTER — Encounter: Payer: Self-pay | Admitting: Internal Medicine

## 2017-02-24 ENCOUNTER — Other Ambulatory Visit (INDEPENDENT_AMBULATORY_CARE_PROVIDER_SITE_OTHER): Payer: BLUE CROSS/BLUE SHIELD

## 2017-02-24 VITALS — BP 118/64 | HR 51 | Temp 98.4°F | Resp 16 | Ht 69.0 in | Wt 250.0 lb

## 2017-02-24 DIAGNOSIS — R7989 Other specified abnormal findings of blood chemistry: Secondary | ICD-10-CM

## 2017-02-24 DIAGNOSIS — R945 Abnormal results of liver function studies: Secondary | ICD-10-CM

## 2017-02-24 DIAGNOSIS — E7800 Pure hypercholesterolemia, unspecified: Secondary | ICD-10-CM

## 2017-02-24 DIAGNOSIS — Z0000 Encounter for general adult medical examination without abnormal findings: Secondary | ICD-10-CM

## 2017-02-24 DIAGNOSIS — Z125 Encounter for screening for malignant neoplasm of prostate: Secondary | ICD-10-CM

## 2017-02-24 LAB — LIPID PANEL
CHOL/HDL RATIO: 3
Cholesterol: 149 mg/dL (ref 0–200)
HDL: 48.7 mg/dL (ref >39.00)
LDL Cholesterol: 80 mg/dL (ref 0–99)
NONHDL: 100.27
TRIGLYCERIDES: 103 mg/dL (ref 0.0–149.0)
VLDL: 20.6 mg/dL (ref 0.0–40.0)

## 2017-02-24 LAB — COMPREHENSIVE METABOLIC PANEL
ALBUMIN: 4.3 g/dL (ref 3.5–5.2)
ALK PHOS: 97 U/L (ref 39–117)
ALT: 27 U/L (ref 0–53)
AST: 25 U/L (ref 0–37)
BILIRUBIN TOTAL: 1.1 mg/dL (ref 0.2–1.2)
BUN: 14 mg/dL (ref 6–23)
CALCIUM: 9.5 mg/dL (ref 8.4–10.5)
CO2: 26 mEq/L (ref 19–32)
CREATININE: 1.05 mg/dL (ref 0.40–1.50)
Chloride: 104 mEq/L (ref 96–112)
GFR: 96.94 mL/min (ref >60.00)
Glucose, Bld: 100 mg/dL — ABNORMAL HIGH (ref 70–99)
Potassium: 4.3 mEq/L (ref 3.5–5.1)
Sodium: 138 mEq/L (ref 135–145)
TOTAL PROTEIN: 6.9 g/dL (ref 6.0–8.3)

## 2017-02-24 LAB — GAMMA GT: GGT: 25 U/L (ref 7–51)

## 2017-02-24 LAB — CBC WITH DIFFERENTIAL/PLATELET
BASOS ABS: 0 10*3/uL (ref 0.0–0.1)
Basophils Relative: 0.3 % (ref 0.0–3.0)
EOS ABS: 0.1 10*3/uL (ref 0.0–0.7)
Eosinophils Relative: 1.6 % (ref 0.0–5.0)
HEMATOCRIT: 42.9 % (ref 39.0–52.0)
Hemoglobin: 14.6 g/dL (ref 13.0–17.0)
LYMPHS PCT: 20.5 % (ref 12.0–46.0)
Lymphs Abs: 1.1 10*3/uL (ref 0.7–4.0)
MCHC: 34 g/dL (ref 30.0–36.0)
MCV: 88.7 fl (ref 78.0–100.0)
MONOS PCT: 8.6 % (ref 3.0–12.0)
Monocytes Absolute: 0.4 10*3/uL (ref 0.1–1.0)
NEUTROS PCT: 69 % (ref 43.0–77.0)
Neutro Abs: 3.6 10*3/uL (ref 1.4–7.7)
Platelets: 186 10*3/uL (ref 150.0–400.0)
RBC: 4.84 Mil/uL (ref 4.22–5.81)
RDW: 13.7 % (ref 11.5–15.5)
WBC: 5.1 10*3/uL (ref 4.0–10.5)

## 2017-02-24 LAB — TSH: TSH: 1.68 u[IU]/mL (ref 0.35–4.50)

## 2017-02-24 LAB — PSA: PSA: 0.24 ng/mL (ref 0.10–4.00)

## 2017-02-24 NOTE — Progress Notes (Signed)
Subjective:  Patient ID: Christopher Byrd, male    DOB: [birth year]  Age: 46 y.o. MRN: [id number]  CC: Annual Exam   HPI Christopher Byrd presents for a CPX - He has a history of elevated liver enzymes consistent with fatty liver disease. He is not willing to get a flu shot today. He feels well and offers no complaints.  Outpatient Medications Prior to Visit  Medication Sig Dispense Refill  . atorvastatin (LIPITOR) 40 MG tablet TAKE 1 TABLET BY MOUTH ONCE DAILY 90 tablet 1  . fluticasone (FLONASE) 50 MCG/ACT nasal spray     . loratadine (CLARITIN) 10 MG tablet Take 10 mg by mouth daily.    . naproxen (NAPROSYN) 375 MG tablet     . sertraline (ZOLOFT) 100 MG tablet 100 mg.    . traMADol (ULTRAM) 50 MG tablet Take 1 tablet (50 mg total) by mouth every 6 (six) hours as needed for moderate pain. 65 tablet 3  . omeprazole (PRILOSEC) 20 MG capsule Take 20 mg by mouth daily.     No facility-administered medications prior to visit.     ROS Review of Systems  Constitutional: Negative.  Negative for appetite change, fatigue and unexpected weight change.  HENT: Negative.   Eyes: Negative.  Negative for visual disturbance.  Respiratory: Negative.  Negative for cough, chest tightness, shortness of breath and wheezing.   Cardiovascular: Negative for chest pain, palpitations and leg swelling.  Gastrointestinal: Negative.  Negative for abdominal pain, constipation, diarrhea, nausea and vomiting.  Endocrine: Negative.   Genitourinary: Negative.  Negative for penile swelling, scrotal swelling, testicular pain and urgency.  Musculoskeletal: Negative.   Skin: Negative.   Allergic/Immunologic: Negative.   Neurological: Negative.   Hematological: Negative for adenopathy. Does not bruise/bleed easily.  Psychiatric/Behavioral: Negative.     Objective:  BP 118/64 (BP Location: Left Arm, Patient Position: Sitting, Cuff Size: Large)   Pulse (!) 51   Temp 98.4 F (36.9 C) (Oral)   Resp 16   Ht 5'  9" (1.753 m)   Wt 250 lb (113.4 kg)   SpO2 97%   BMI 36.92 kg/m   BP Readings from Last 3 Encounters:  02/24/17 118/64  01/03/16 110/74  12/04/15 104/64    Wt Readings from Last 3 Encounters:  02/24/17 250 lb (113.4 kg)  01/03/16 256 lb 4 oz (116.2 kg)  12/04/15 256 lb (116.1 kg)    Physical Exam  Constitutional: No distress.  HENT:  Mouth/Throat: Oropharynx is clear and moist. No oropharyngeal exudate.  Eyes: Conjunctivae are normal. Right eye exhibits no discharge. Left eye exhibits no discharge. No scleral icterus.  Neck: Normal range of motion. Neck supple. No JVD present. No thyromegaly present.  Cardiovascular: Normal rate, regular rhythm and intact distal pulses.  Exam reveals no gallop and no friction rub.   No murmur heard. Pulmonary/Chest: Effort normal and breath sounds normal. No respiratory distress. He has no wheezes. He has no rales. He exhibits no tenderness.  Abdominal: Soft. Bowel sounds are normal. He exhibits no distension and no mass. There is no tenderness. There is no rebound and no guarding. Hernia confirmed negative in the right inguinal area and confirmed negative in the left inguinal area.  Genitourinary: Rectum normal, prostate normal, testes normal and penis normal. Rectal exam shows no external hemorrhoid, no internal hemorrhoid, no fissure, no mass, no tenderness, anal tone normal and guaiac negative stool. Prostate is not enlarged and not tender. Right testis shows no mass, no swelling and  no tenderness. Right testis is descended. Left testis shows no mass, no swelling and no tenderness. Left testis is descended. Circumcised. No penile erythema or penile tenderness. No discharge found.  Musculoskeletal: Normal range of motion. He exhibits no edema or tenderness.  Lymphadenopathy:    He has no cervical adenopathy.       Right: No inguinal adenopathy present.       Left: No inguinal adenopathy present.  Neurological: He is alert.  Skin: Skin is warm  and dry. He is not diaphoretic.  Psychiatric: He has a normal mood and affect. His behavior is normal.  Vitals reviewed.   Lab Results  Component Value Date   WBC 5.1 02/24/2017   HGB 14.6 02/24/2017   HCT 42.9 02/24/2017   PLT 186.0 02/24/2017   GLUCOSE 100 (H) 02/24/2017   CHOL 149 02/24/2017   TRIG 103.0 02/24/2017   HDL 48.70 02/24/2017   LDLCALC 80 02/24/2017   ALT 27 02/24/2017   AST 25 02/24/2017   NA 138 02/24/2017   K 4.3 02/24/2017   CL 104 02/24/2017   CREATININE 1.05 02/24/2017   BUN 14 02/24/2017   CO2 26 02/24/2017   TSH 1.68 02/24/2017   PSA 0.24 02/24/2017    Dg Lumbar Spine Complete  Result Date: 12/05/2015 CLINICAL DATA:  Back injury. EXAM: LUMBAR SPINE - COMPLETE 4+ VIEW COMPARISON:  12/10/2012. FINDINGS: No acute bony abnormality identified. Degenerative changes lumbar spine. Mild degenerative changes have increased slightly from 12/10/2012 . Mild scoliosis lumbar spine. Stable calcifications noted over the right abdomen. IMPRESSION: Diffuse degenerative change lumbar spine with mild scoliosis. Diffuse degenerative changes have increased slightly from 12/10/2012 . No acute abnormality . Electronically Signed   By: Marcello Moores  Register   On: 12/05/2015 08:19    Assessment & Plan:   Nolawi was seen today for annual exam.  Diagnoses and all orders for this visit:  Routine general medical examination at a health care facility- Exam completed, labs reviewed, he refused a flu vaccine, patient education material was given. -     Lipid panel; Future -     PSA; Future  Pure hypercholesterolemia- he has achieved his LDL goal is doing well on the statin. -     TSH; Future  Elevated LFTs- his liver enzymes are normal now. He has positive immunity to Hep A/B consistent with prior vaccinations. Screening for hepatitis C and HIV is negative. This is all consistent with fatty liver disease. He was praised for his lifestyle modifications and asked to continue with  same. -     Comprehensive metabolic panel; Future -     CBC with Differential/Platelet; Future -     Hepatitis A antibody, total; Future -     Hepatitis B core antibody, total; Future -     Hepatitis B surface antibody; Future -     Hepatitis C antibody; Future -     Gamma GT; Future -     HIV antibody; Future   I have discontinued Mr. Montenegro omeprazole and traMADol. I am also having him maintain his naproxen, fluticasone, loratadine, sertraline, and atorvastatin.  No orders of the defined types were placed in this encounter.    Follow-up: Return if symptoms worsen or fail to improve.  Scarlette Calico, MD

## 2017-02-24 NOTE — Patient Instructions (Signed)

## 2017-02-25 ENCOUNTER — Encounter: Payer: Self-pay | Admitting: Internal Medicine

## 2017-02-25 LAB — HEPATITIS B CORE ANTIBODY, TOTAL: Hep B Core Total Ab: NONREACTIVE

## 2017-02-25 LAB — HEPATITIS B SURFACE ANTIBODY,QUALITATIVE: Hep B S Ab: REACTIVE — AB

## 2017-02-25 LAB — HEPATITIS C ANTIBODY
Hepatitis C Ab: NONREACTIVE
SIGNAL TO CUT-OFF: 0.04 (ref <1.00)

## 2017-02-25 LAB — HIV ANTIBODY (ROUTINE TESTING W REFLEX): HIV: NONREACTIVE

## 2017-02-25 LAB — HEPATITIS A ANTIBODY, TOTAL: Hepatitis A AB,Total: REACTIVE — AB

## 2017-05-30 ENCOUNTER — Other Ambulatory Visit: Payer: Self-pay | Admitting: Internal Medicine

## 2017-10-21 DIAGNOSIS — S62309A Unspecified fracture of unspecified metacarpal bone, initial encounter for closed fracture: Secondary | ICD-10-CM | POA: Insufficient documentation

## 2017-11-27 ENCOUNTER — Other Ambulatory Visit: Payer: Self-pay | Admitting: Internal Medicine

## 2017-12-05 ENCOUNTER — Other Ambulatory Visit (INDEPENDENT_AMBULATORY_CARE_PROVIDER_SITE_OTHER): Payer: BLUE CROSS/BLUE SHIELD

## 2017-12-05 ENCOUNTER — Encounter: Payer: Self-pay | Admitting: Family

## 2017-12-05 ENCOUNTER — Ambulatory Visit: Payer: BLUE CROSS/BLUE SHIELD | Admitting: Family

## 2017-12-05 VITALS — BP 122/80 | HR 65 | Temp 98.6°F | Ht 69.0 in | Wt 232.0 lb

## 2017-12-05 DIAGNOSIS — M722 Plantar fascial fibromatosis: Secondary | ICD-10-CM | POA: Diagnosis not present

## 2017-12-05 DIAGNOSIS — R809 Proteinuria, unspecified: Secondary | ICD-10-CM

## 2017-12-05 LAB — MICROALBUMIN / CREATININE URINE RATIO
CREATININE, U: 307.4 mg/dL
MICROALB/CREAT RATIO: 2.3 mg/g (ref 0.0–30.0)
Microalb, Ur: 7.2 mg/dL — ABNORMAL HIGH (ref 0.0–1.9)

## 2017-12-05 LAB — URINALYSIS
Bilirubin Urine: NEGATIVE
Hgb urine dipstick: NEGATIVE
KETONES UR: NEGATIVE
LEUKOCYTES UA: NEGATIVE
Nitrite: NEGATIVE
Specific Gravity, Urine: >=1.03 — AB (ref 1.000–1.030)
URINE GLUCOSE: NEGATIVE
UROBILINOGEN UA: 0.2 (ref 0.0–1.0)
pH: 5.5 (ref 5.0–8.0)

## 2017-12-05 NOTE — Progress Notes (Signed)
Christopher Byrd is a 47 y.o. male with the following history as recorded in EpicCare:  Patient Active Problem List   Diagnosis Date Noted  . Elevated LFTs 02/24/2017  . DDD (degenerative disc disease), lumbar 12/04/2015  . Erectile dysfunction 04/15/2013  . Low back pain 12/10/2012  . Routine general medical examination at a health care facility 05/11/2012  . Pure hypercholesterolemia 08/09/2011    Current Outpatient Medications  Medication Sig Dispense Refill  . atorvastatin (LIPITOR) 40 MG tablet TAKE 1 TABLET BY MOUTH EVERY DAY 90 tablet 1  . baclofen (LIORESAL) 10 MG tablet baclofen 10 mg tablet    . buPROPion (WELLBUTRIN SR) 150 MG 12 hr tablet Take 150 mg by mouth 2 (two) times daily.  5  . clonazePAM (KLONOPIN) 1 MG tablet clonazepam 1 mg tablet    . fluticasone (FLONASE) 50 MCG/ACT nasal spray     . loratadine (CLARITIN) 10 MG tablet Take 10 mg by mouth daily.    . naproxen (NAPROSYN) 375 MG tablet     . ranitidine (ZANTAC) 150 MG tablet ranitidine 150 mg tablet    . traZODone (DESYREL) 50 MG tablet Take 200 mg by mouth at bedtime.  1   No current facility-administered medications for this visit.     Allergies: Simvastatin  Past Medical History:  Diagnosis Date  . Hyperlipidemia     Past Surgical History:  Procedure Laterality Date  . APPENDECTOMY  2002    Family History  Problem Relation Age of Onset  . Diabetes Other   . Hypertension Other   . Cancer Other        Prostate and Breast Cancer  . Alcohol abuse Neg Hx   . Drug abuse Neg Hx   . Heart disease Neg Hx   . Hyperlipidemia Neg Hx   . Stroke Neg Hx   . Breast cancer Mother   . Hypertension Father   . Diabetes Father   . Sickle cell anemia Brother     Social History   Tobacco Use  . Smoking status: Never Smoker  . Smokeless tobacco: Never Used  Substance Use Topics  . Alcohol use: Yes    Alcohol/week: 3.0 oz    Types: 5 Cans of beer per week    Subjective:  Right foot pain x 2-3 days; no  known injury but does have history of plantar fasciitis; was walking for exercise and symptoms seemed to start after the walk; was wearing Nike tennis shoes; has not had problems with plantar fasciitis in about 2 years; feels like pain is localized in his heel and very painful when he first steps on the floor in the morning; takes Naproxen bid for chronic back pain; has not seen any benefit for his foot;  Also told recently that he had protein in his urine at [facility] license exam; no blood or glucose in the urine; no frequency or urgency; no pain with urination; per patient, labs done at time of exam were normal; no prior history of proteinuria.   Objective:  Vitals:   12/05/17 1454  BP: 122/80  Pulse: 65  Temp: 98.6 F (37 C)  TempSrc: Oral  SpO2: 98%  Weight: 232 lb 0.6 oz (105.3 kg)  Height: 5\' 9"  (1.753 m)    General: Well developed, well nourished, in no acute distress  Skin : Warm and dry.  Head: Normocephalic and atraumatic  Lungs: Respirations unlabored; Musculoskeletal: No deformities; no active joint inflammation  Extremities: No edema, cyanosis, clubbing  Vessels:  Symmetric bilaterally  Neurologic: Alert and oriented; speech intact; face symmetrical; moves all extremities well; CNII-XII intact without focal deficit  Assessment:  1. Plantar fasciitis of right foot   2. Proteinuria, unspecified type     Plan:  1. Benefits of ice and stretching discussed with patient; encouraged to wear supportive shoes; will refer to podiatry per patient request; 2. Update U/A and microalbumin today; he is to keep upcoming follow-up with his PCP on July 8 to review and discuss results.   No follow-ups on file.  Orders Placed This Encounter  Procedures  . Urinalysis    Standing Status:   Future    Number of Occurrences:   1    Standing Expiration Date:   12/05/2018  . Urine Microalbumin w/creat. ratio    Standing Status:   Future    Number of Occurrences:   1    Standing Expiration Date:    12/05/2018  . Ambulatory referral to Podiatry    Referral Priority:   Routine    Referral Type:   Consultation    Referral Reason:   Specialty Services Required    Requested Specialty:   Podiatry    Number of Visits Requested:   1    Requested Prescriptions    No prescriptions requested or ordered in this encounter

## 2017-12-05 NOTE — Patient Instructions (Signed)

## 2017-12-15 ENCOUNTER — Ambulatory Visit: Payer: Self-pay | Admitting: Internal Medicine

## 2017-12-20 ENCOUNTER — Ambulatory Visit (INDEPENDENT_AMBULATORY_CARE_PROVIDER_SITE_OTHER): Payer: BLUE CROSS/BLUE SHIELD

## 2017-12-20 ENCOUNTER — Ambulatory Visit (INDEPENDENT_AMBULATORY_CARE_PROVIDER_SITE_OTHER): Payer: BLUE CROSS/BLUE SHIELD | Admitting: Podiatry

## 2017-12-20 DIAGNOSIS — M722 Plantar fascial fibromatosis: Secondary | ICD-10-CM

## 2017-12-20 DIAGNOSIS — M778 Other enthesopathies, not elsewhere classified: Secondary | ICD-10-CM

## 2017-12-20 DIAGNOSIS — M216X9 Other acquired deformities of unspecified foot: Secondary | ICD-10-CM

## 2017-12-20 DIAGNOSIS — M779 Enthesopathy, unspecified: Secondary | ICD-10-CM

## 2017-12-20 MED ORDER — MELOXICAM 15 MG PO TABS: 15.0000 mg | ORAL_TABLET | Freq: Every day | ORAL | 0 refills | Status: DC

## 2017-12-20 NOTE — Patient Instructions (Signed)

## 2017-12-20 NOTE — Progress Notes (Signed)
  Subjective:  Patient ID: Christopher Byrd, male    DOB: 1970-12-08,  MRN: [id number]  Chief Complaint  Patient presents with  . Plantar Fasciitis    right foot   47 y.o. male presents with the above complaint. Has history of heel pain treated with naproxen. Never had injections. Had chronic plantar fasciitis for years, believes it traces back to being in the [employer]. Denies other pedal issues.  Recent L hand boxer's fracture surgery.  Past Medical History:  Diagnosis Date  . Hyperlipidemia    Past Surgical History:  Procedure Laterality Date  . APPENDECTOMY  2002    Current Outpatient Medications:  .  atorvastatin (LIPITOR) 40 MG tablet, TAKE 1 TABLET BY MOUTH EVERY DAY, Disp: 90 tablet, Rfl: 1 .  baclofen (LIORESAL) 10 MG tablet, baclofen 10 mg tablet, Disp: , Rfl:  .  buPROPion (WELLBUTRIN SR) 150 MG 12 hr tablet, Take 150 mg by mouth 2 (two) times daily., Disp: , Rfl: 5 .  clonazePAM (KLONOPIN) 1 MG tablet, clonazepam 1 mg tablet, Disp: , Rfl:  .  fluticasone (FLONASE) 50 MCG/ACT nasal spray, , Disp: , Rfl:  .  loratadine (CLARITIN) 10 MG tablet, Take 10 mg by mouth daily., Disp: , Rfl:  .  meloxicam (MOBIC) 15 MG tablet, Take 1 tablet (15 mg total) by mouth daily., Disp: 30 tablet, Rfl: 0 .  naproxen (NAPROSYN) 375 MG tablet, , Disp: , Rfl:  .  ranitidine (ZANTAC) 150 MG tablet, ranitidine 150 mg tablet, Disp: , Rfl:  .  traZODone (DESYREL) 50 MG tablet, Take 200 mg by mouth at bedtime., Disp: , Rfl: 1  Allergies  Allergen Reactions  . Simvastatin     Muscle aches   Review of Systems: Negative except as noted in the HPI. Denies N/V/F/Ch. Objective:  There were no vitals filed for this visit. General AA&O x3. Normal mood and affect.  Vascular Dorsalis pedis and posterior tibial pulses  present 2+ bilaterally  Capillary refill normal to all digits. Pedal hair growth normal.  Neurologic Epicritic sensation grossly present.  Dermatologic No open lesions. Interspaces  clear of maceration. Nails well groomed and normal in appearance.  Orthopedic: MMT 5/5 in dorsiflexion, plantarflexion, inversion, and eversion. Normal joint ROM without pain or crepitus. POP R medial calc tuber Decreased ankle joint ROM R, silvershiold test + right   Assessment & Plan:  Patient was evaluated and treated and all questions answered.  Plantar Fasciitis, right - XR reviewed as above.  - Educated on icing and stretching. Instructions given.  - Injection delivered to the plantar fascia as below. - Night splint dispensed.  Procedure: Injection Tendon/Ligament Location: Right plantar fascia at the glabrous junction; medial approach. Skin Prep: Alcohol. Injectate: 1 cc 0.5% marcaine plain, 1 cc dexamethasone phosphate, 0.5 cc kenalog 10. Disposition: Patient tolerated procedure well. Injection site dressed with a band-aid.  No follow-ups on file.

## 2018-01-15 ENCOUNTER — Ambulatory Visit (INDEPENDENT_AMBULATORY_CARE_PROVIDER_SITE_OTHER): Payer: BLUE CROSS/BLUE SHIELD | Admitting: Podiatry

## 2018-01-15 DIAGNOSIS — M722 Plantar fascial fibromatosis: Secondary | ICD-10-CM | POA: Diagnosis not present

## 2018-01-15 DIAGNOSIS — M2141 Flat foot [pes planus] (acquired), right foot: Secondary | ICD-10-CM

## 2018-01-15 DIAGNOSIS — M2142 Flat foot [pes planus] (acquired), left foot: Secondary | ICD-10-CM

## 2018-01-15 NOTE — Progress Notes (Signed)
  Subjective:  Patient ID: Christopher Byrd, male    DOB: 01-Mar-1971,  MRN: [id number]  Chief Complaint  Patient presents with  . Plantar Fasciitis    3 wk fu Plantar fasciitis, Right.   47 y.o. male returns for the above complaint. Doing better but still has some pain. Injection helped. Pain level today 4/10. Has had this issue recur several times. Never had custom orthotics.  Objective:  There were no vitals filed for this visit. General AA&O x3. Normal mood and affect.  Vascular Pedal pulses palpable.  Neurologic Epicritic sensation grossly intact.  Dermatologic No open lesions. Skin normal texture and turgor.  Orthopedic: Pain to palpation about the medial calcaneal tuber right Pes planus bilaterally.   Assessment & Plan:  Patient was evaluated and treated and all questions answered.  Plantar Fasciitis, right - Injection #2 delivered to the plantar fascia as below.  Procedure: Injection Tendon/Ligament Location: Right plantar fascia at the glabrous junction; medial approach. Skin Prep: Alcohol. Injectate: 1 cc 0.5% marcaine plain, 1 cc dexamethasone phosphate, 0.5 cc kenalog 10. Disposition: Patient tolerated procedure well. Injection site dressed with a band-aid.  Pes Planus with Recurrent Plantar Fasciitis. -Would benefit from [employer]. Will make appt for fabrication.  No follow-ups on file.

## 2018-01-22 ENCOUNTER — Ambulatory Visit (INDEPENDENT_AMBULATORY_CARE_PROVIDER_SITE_OTHER): Payer: BLUE CROSS/BLUE SHIELD | Admitting: Orthotics

## 2018-01-22 DIAGNOSIS — M778 Other enthesopathies, not elsewhere classified: Secondary | ICD-10-CM

## 2018-01-22 DIAGNOSIS — M779 Enthesopathy, unspecified: Secondary | ICD-10-CM

## 2018-01-22 DIAGNOSIS — M2141 Flat foot [pes planus] (acquired), right foot: Secondary | ICD-10-CM

## 2018-01-22 DIAGNOSIS — M722 Plantar fascial fibromatosis: Secondary | ICD-10-CM | POA: Diagnosis not present

## 2018-01-22 DIAGNOSIS — M216X9 Other acquired deformities of unspecified foot: Secondary | ICD-10-CM

## 2018-01-22 DIAGNOSIS — M2142 Flat foot [pes planus] (acquired), left foot: Secondary | ICD-10-CM

## 2018-01-22 NOTE — Progress Notes (Signed)

## 2018-02-04 ENCOUNTER — Ambulatory Visit (INDEPENDENT_AMBULATORY_CARE_PROVIDER_SITE_OTHER)
Admission: RE | Admit: 2018-02-04 | Discharge: 2018-02-04 | Disposition: A | Discharge status: Home / Self Care | Payer: BLUE CROSS/BLUE SHIELD | Source: Ambulatory Visit | Attending: Family | Admitting: Family

## 2018-02-04 ENCOUNTER — Other Ambulatory Visit (INDEPENDENT_AMBULATORY_CARE_PROVIDER_SITE_OTHER): Payer: BLUE CROSS/BLUE SHIELD

## 2018-02-04 ENCOUNTER — Encounter: Payer: Self-pay | Admitting: Family

## 2018-02-04 ENCOUNTER — Encounter: Payer: Self-pay | Admitting: Gastroenterology

## 2018-02-04 ENCOUNTER — Ambulatory Visit: Payer: Self-pay | Admitting: Internal Medicine

## 2018-02-04 ENCOUNTER — Ambulatory Visit: Payer: BLUE CROSS/BLUE SHIELD | Admitting: Family

## 2018-02-04 VITALS — BP 124/82 | HR 64 | Temp 98.6°F | Ht 69.0 in | Wt 231.1 lb

## 2018-02-04 DIAGNOSIS — R109 Unspecified abdominal pain: Secondary | ICD-10-CM

## 2018-02-04 DIAGNOSIS — K625 Hemorrhage of anus and rectum: Secondary | ICD-10-CM

## 2018-02-04 LAB — CBC WITH DIFFERENTIAL/PLATELET
BASOS PCT: 0.4 % (ref 0.0–3.0)
Basophils Absolute: 0 10*3/uL (ref 0.0–0.1)
EOS ABS: 0 10*3/uL (ref 0.0–0.7)
EOS PCT: 0.3 % (ref 0.0–5.0)
HCT: 45.4 % (ref 39.0–52.0)
HEMOGLOBIN: 15.5 g/dL (ref 13.0–17.0)
LYMPHS ABS: 1.3 10*3/uL (ref 0.7–4.0)
Lymphocytes Relative: 11.7 % — ABNORMAL LOW (ref 12.0–46.0)
MCHC: 34.1 g/dL (ref 30.0–36.0)
MCV: 89.7 fl (ref 78.0–100.0)
MONO ABS: 0.8 10*3/uL (ref 0.1–1.0)
Monocytes Relative: 7.2 % (ref 3.0–12.0)
NEUTROS ABS: 9 10*3/uL — AB (ref 1.4–7.7)
NEUTROS PCT: 80.4 % — AB (ref 43.0–77.0)
PLATELETS: 199 10*3/uL (ref 150.0–400.0)
RBC: 5.06 Mil/uL (ref 4.22–5.81)
RDW: 13.3 % (ref 11.5–15.5)
WBC: 11.2 10*3/uL — AB (ref 4.0–10.5)

## 2018-02-04 LAB — COMPREHENSIVE METABOLIC PANEL
ALBUMIN: 4.3 g/dL (ref 3.5–5.2)
ALT: 19 U/L (ref 0–53)
AST: 15 U/L (ref 0–37)
Alkaline Phosphatase: 94 U/L (ref 39–117)
BUN: 12 mg/dL (ref 6–23)
CO2: 25 meq/L (ref 19–32)
CREATININE: 1.14 mg/dL (ref 0.40–1.50)
Calcium: 9.3 mg/dL (ref 8.4–10.5)
Chloride: 102 mEq/L (ref 96–112)
GFR: 87.82 mL/min (ref >60.00)
GLUCOSE: 93 mg/dL (ref 70–99)
POTASSIUM: 4 meq/L (ref 3.5–5.1)
SODIUM: 137 meq/L (ref 135–145)
Total Bilirubin: 1.2 mg/dL (ref 0.2–1.2)
Total Protein: 7.3 g/dL (ref 6.0–8.3)

## 2018-02-04 NOTE — Patient Instructions (Signed)
Food Choices to Help Relieve Diarrhea, Adult  When you have diarrhea, the foods you eat and your eating habits are very important. Choosing the right foods and drinks can help:  · Relieve diarrhea.  · Replace lost fluids and nutrients.  · Prevent dehydration.    What general guidelines should I follow?  Relieving diarrhea  · Choose foods with less than 2 g or .07 oz. of fiber per serving.  · Limit fats to less than 8 tsp (38 g or 1.34 oz.) a day.  · Avoid the following:  ? Foods and beverages sweetened with high-fructose corn syrup, honey, or sugar alcohols such as xylitol, sorbitol, and mannitol.  ? Foods that contain a lot of fat or sugar.  ? Fried, greasy, or spicy foods.  ? High-fiber grains, breads, and cereals.  ? Raw fruits and vegetables.  · Eat foods that are rich in probiotics. These foods include dairy products such as yogurt and fermented milk products. They help increase healthy bacteria in the stomach and intestines (gastrointestinal tract, or GI tract).  · If you have lactose intolerance, avoid dairy products. These may make your diarrhea worse.  · Take medicine to help stop diarrhea (antidiarrheal medicine) only as told by your health care provider.  Replacing nutrients  · Eat small meals or snacks every 3–4 hours.  · Eat bland foods, such as white rice, toast, or baked potato, until your diarrhea starts to get better. Gradually reintroduce nutrient-rich foods as tolerated or as told by your health care provider. This includes:  ? Well-cooked protein foods.  ? Peeled, seeded, and soft-cooked fruits and vegetables.  ? Low-fat dairy products.  · Take vitamin and mineral supplements as told by your health care provider.  Preventing dehydration    · Start by sipping water or a special solution to prevent dehydration (oral rehydration solution, ORS). Urine that is clear or pale yellow means that you are getting enough fluid.  · Try to drink at least 8–10 cups of fluid each day to help replace lost  fluids.  · You may add other liquids in addition to water, such as clear juice or decaffeinated sports drinks, as tolerated or as told by your health care provider.  · Avoid drinks with caffeine, such as coffee, tea, or soft drinks.  · Avoid alcohol.  What foods are recommended?  The items listed may not be a complete list. Talk with your health care provider about what dietary choices are best for you.  Grains  White rice. White, French, or pita breads (fresh or toasted), including plain rolls, buns, or bagels. White pasta. Saltine, soda, or graham crackers. Pretzels. Low-fiber cereal. Cooked cereals made with water (such as cornmeal, farina, or cream cereals). Plain muffins. Matzo. Melba toast. Zwieback.  Vegetables  Potatoes (without the skin). Most well-cooked and canned vegetables without skins or seeds. Tender lettuce.  Fruits  Apple sauce. Fruits canned in juice. Cooked apricots, cherries, grapefruit, peaches, pears, or plums. Fresh bananas and cantaloupe.  Meats and other protein foods  Baked or boiled chicken. Eggs. Tofu. Fish. Seafood. Smooth nut butters. Ground or well-cooked tender beef, ham, veal, lamb, pork, or poultry.  Dairy  Plain yogurt, kefir, and unsweetened liquid yogurt. Lactose-free milk, buttermilk, skim milk, or soy milk. Low-fat or nonfat hard cheese.  Beverages  Water. Low-calorie sports drinks. Fruit juices without pulp. Strained tomato and vegetable juices. Decaffeinated teas. Sugar-free beverages not sweetened with sugar alcohols. Oral rehydration solutions, if approved by your health care   provider.  Seasoning and other foods  Bouillon, broth, or soups made from recommended foods.  What foods are not recommended?  The items listed may not be a complete list. Talk with your health care provider about what dietary choices are best for you.  Grains  Whole grain, whole wheat, bran, or rye breads, rolls, pastas, and crackers. Wild or brown rice. Whole grain or bran cereals. Barley. Oats  and oatmeal. Corn tortillas or taco shells. Granola. Popcorn.  Vegetables  Raw vegetables. Fried vegetables. Cabbage, broccoli, Brussels sprouts, artichokes, baked beans, beet greens, corn, kale, legumes, peas, sweet potatoes, and yams. Potato skins. Cooked spinach and cabbage.  Fruits  Dried fruit, including raisins and dates. Raw fruits. Stewed or dried prunes. Canned fruits with syrup.  Meat and other protein foods  Fried or fatty meats. Deli meats. Chunky nut butters. Nuts and seeds. Beans and lentils. Bacon. Hot dogs. Sausage.  Dairy  High-fat cheeses. Whole milk, chocolate milk, and beverages made with milk, such as milk shakes. Half-and-half. Cream. sour cream. Ice cream.  Beverages  Caffeinated beverages (such as coffee, tea, soda, or energy drinks). Alcoholic beverages. Fruit juices with pulp. Prune juice. Soft drinks sweetened with high-fructose corn syrup or sugar alcohols. High-calorie sports drinks.  Fats and oils  Butter. Cream sauces. Margarine. Salad oils. Plain salad dressings. Olives. Avocados. Mayonnaise.  Sweets and desserts  Sweet rolls, doughnuts, and sweet breads. Sugar-free desserts sweetened with sugar alcohols such as xylitol and sorbitol.  Seasoning and other foods  Honey. Hot sauce. Chili powder. Gravy. Cream-based or milk-based soups. Pancakes and waffles.  Summary  · When you have diarrhea, the foods you eat and your eating habits are very important.  · Make sure you get at least 8–10 cups of fluid each day, or enough to keep your urine clear or pale yellow.  · Eat bland foods and gradually reintroduce healthy, nutrient-rich foods as tolerated, or as told by your health care provider.  · Avoid high-fiber, fried, greasy, or spicy foods.  This information is not intended to replace advice given to you by your health care provider. Make sure you discuss any questions you have with your health care provider.  Document Released: 08/17/2003 Document Revised: 05/24/2016 Document  Reviewed: 05/24/2016  Elsevier Interactive Patient Education © 2018 [employer] Inc[employer]

## 2018-02-04 NOTE — Telephone Encounter (Signed)
Called in c/o having some blood in his stools for 2-3 days.    He has been sick with diarrhea.   Some mild abd cramping.   "My bottom is really sore from all the diarrhea".     The diarrhea is resolving and his stools are becoming more formed but he is concerned about the blood in his stool.   It is not dripping into the toilet, denies passing blood clots, dizziness.  I scheduled him with Jodi Mourning, NP for today at 1:40.   Dr. Ronnald Ramp his PCP was booked.    Reason for Disposition . MILD rectal bleeding (more than just a few drops or streaks)  Answer Assessment - Initial Assessment Questions 1. APPEARANCE of BLOOD: "What color is it?" "Is it passed separately, on the surface of the stool, or mixed in with the stool?"      I was having diarrhea when it first started.   Diarrhea kept going but then blood was in my stool.   My abd started hurting.     2. AMOUNT: "How much blood was passed?"      Blood within the stool which has mucus on it.     3. FREQUENCY: "How many times has blood been passed with the stools?"      Last 2 days.    My bottom is really sore.   No diarrhea today.    It's becoming more solid.    4. ONSET: "When was the blood first seen in the stools?" (Days or weeks)      2 days ago. 5. DIARRHEA: "Is there also some diarrhea?" If so, ask: "How many diarrhea stools were passed in past 24 hours?"      Not now.   The stools are becoming more formed. 6. CONSTIPATION: "Do you have constipation?" If so, "How bad is it?"     No.   I've had diarrhea. 7. RECURRENT SYMPTOMS: "Have you had blood in your stools before?" If so, ask: "When was the last time?" and "What happened that time?"      No 8. BLOOD THINNERS: "Do you take any blood thinners?" (e.g., Coumadin/warfarin, Pradaxa/dabigatran, aspirin)     No 9. OTHER SYMPTOMS: "Do you have any other symptoms?"  (e.g., abdominal pain, vomiting, dizziness, fever)     Abd cramping comes and goes.   It's getting better too. 10. PREGNANCY:  "Is there any chance you are pregnant?" "When was your last menstrual period?"       N/A  Protocols used: RECTAL BLEEDING-A-AH

## 2018-02-04 NOTE — Progress Notes (Signed)
Christopher Byrd is a 47 y.o. male with the following history as recorded in EpicCare:  Patient Active Problem List   Diagnosis Date Noted  . Elevated LFTs 02/24/2017  . DDD (degenerative disc disease), lumbar 12/04/2015  . Erectile dysfunction 04/15/2013  . Low back pain 12/10/2012  . Routine general medical examination at a health care facility 05/11/2012  . Pure hypercholesterolemia 08/09/2011    Current Outpatient Medications  Medication Sig Dispense Refill  . atorvastatin (LIPITOR) 40 MG tablet TAKE 1 TABLET BY MOUTH EVERY DAY 90 tablet 1  . baclofen (LIORESAL) 10 MG tablet baclofen 10 mg tablet    . buPROPion (WELLBUTRIN SR) 150 MG 12 hr tablet Take 150 mg by mouth 2 (two) times daily.  5  . clonazePAM (KLONOPIN) 1 MG tablet clonazepam 1 mg tablet    . fluticasone (FLONASE) 50 MCG/ACT nasal spray     . loratadine (CLARITIN) 10 MG tablet Take 10 mg by mouth daily.    . naproxen sodium (ANAPROX) 550 MG tablet naproxen sodium 550 mg tablet    . ranitidine (ZANTAC) 150 MG tablet ranitidine 150 mg tablet    . traZODone (DESYREL) 50 MG tablet Take 200 mg by mouth at bedtime.  1   No current facility-administered medications for this visit.     Allergies: Simvastatin  Past Medical History:  Diagnosis Date  . Hyperlipidemia     Past Surgical History:  Procedure Laterality Date  . APPENDECTOMY  2002    Family History  Problem Relation Age of Onset  . Diabetes Other   . Hypertension Other   . Cancer Other        Prostate and Breast Cancer  . Alcohol abuse Neg Hx   . Drug abuse Neg Hx   . Heart disease Neg Hx   . Hyperlipidemia Neg Hx   . Stroke Neg Hx   . Breast cancer Mother   . Hypertension Father   . Diabetes Father   . Sickle cell anemia Brother     Social History   Tobacco Use  . Smoking status: Never Smoker  . Smokeless tobacco: Never Used  Substance Use Topics  . 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  Head: Normocephalic and atraumatic  Lungs: Respirations unlabored; clear to auscultation bilaterally without wheeze, rales, rhonchi  CVS exam: normal rate and regular rhythm.  Abdomen: Soft; nontender; nondistended; normoactive bowel sounds; no masses or hepatosplenomegaly  Neurologic: Alert and oriented; speech intact; face symmetrical; moves all extremities well; CNII-XII intact without focal deficit   Assessment:  1. Rectal bleeding   2. Abdominal  pain, unspecified abdominal location     Plan:  ? If blood due to acute diarrhea; will update CBC and X-ray today; symptoms are improving; referral to GI for further evaluation/ colonoscopy; follow-up to be determined.    No follow-ups on file.  Orders Placed This Encounter  Procedures  . DG Abd 2 Views    Standing Status:   Future    Standing Expiration Date:   04/07/2019    Order Specific Question:   Reason for Exam (SYMPTOM  OR DIAGNOSIS REQUIRED)    Answer:   abdominal pain    Order Specific Question:   Preferred imaging location?    Answer:   [facility]    Order Specific Question:   Radiology Contrast Protocol - do NOT remove file path    Answer:   [email or web address]  . CBC w/Diff     Standing Status:   Future    Standing Expiration Date:   02/04/2019  . Comp Met (CMET)    Standing Status:   Future    Standing Expiration Date:   02/04/2019  . Ambulatory referral to Gastroenterology    Referral Priority:   Routine    Referral Type:   Consultation    Referral Reason:   Specialty Services Required    Number of Visits Requested:   1    Requested Prescriptions    No prescriptions requested or ordered in this encounter

## 2018-02-04 NOTE — Telephone Encounter (Signed)
FYI

## 2018-02-12 ENCOUNTER — Ambulatory Visit: Payer: BLUE CROSS/BLUE SHIELD | Admitting: Orthotics

## 2018-02-12 DIAGNOSIS — M216X9 Other acquired deformities of unspecified foot: Secondary | ICD-10-CM

## 2018-02-12 DIAGNOSIS — M722 Plantar fascial fibromatosis: Secondary | ICD-10-CM

## 2018-02-12 NOTE — Progress Notes (Signed)
Patient came in today to pick up custom made foot orthotics.  The goals were accomplished and the patient reported no dissatisfaction with said orthotics.  Patient was advised of breakin period and how to report any issues. 

## 2018-03-16 IMAGING — DX DG LUMBAR SPINE COMPLETE 4+V
5 series · 5 of 5 positions shown · non-contrast
Comparison: 12/10/2012.

CLINICAL DATA: Back injury.

EXAM:
LUMBAR SPINE - COMPLETE 4+ VIEW

[l-spine ap]
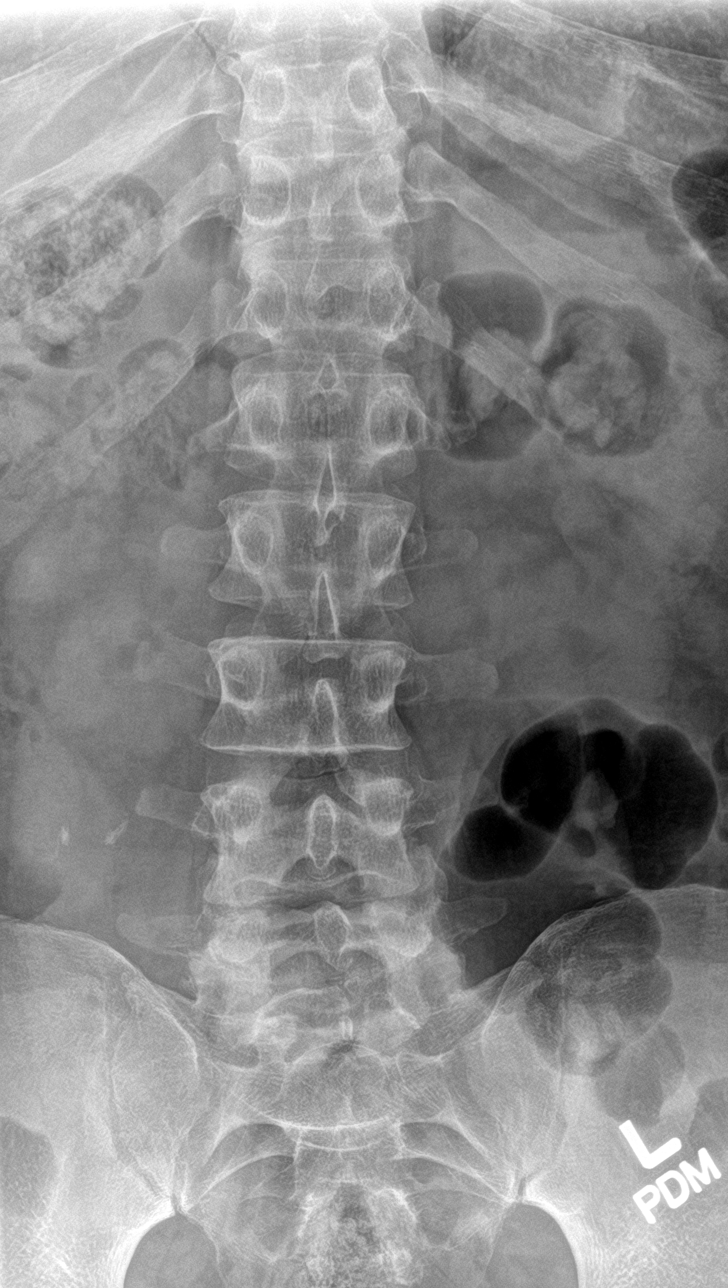

[l-spine obl (1 of 2)]
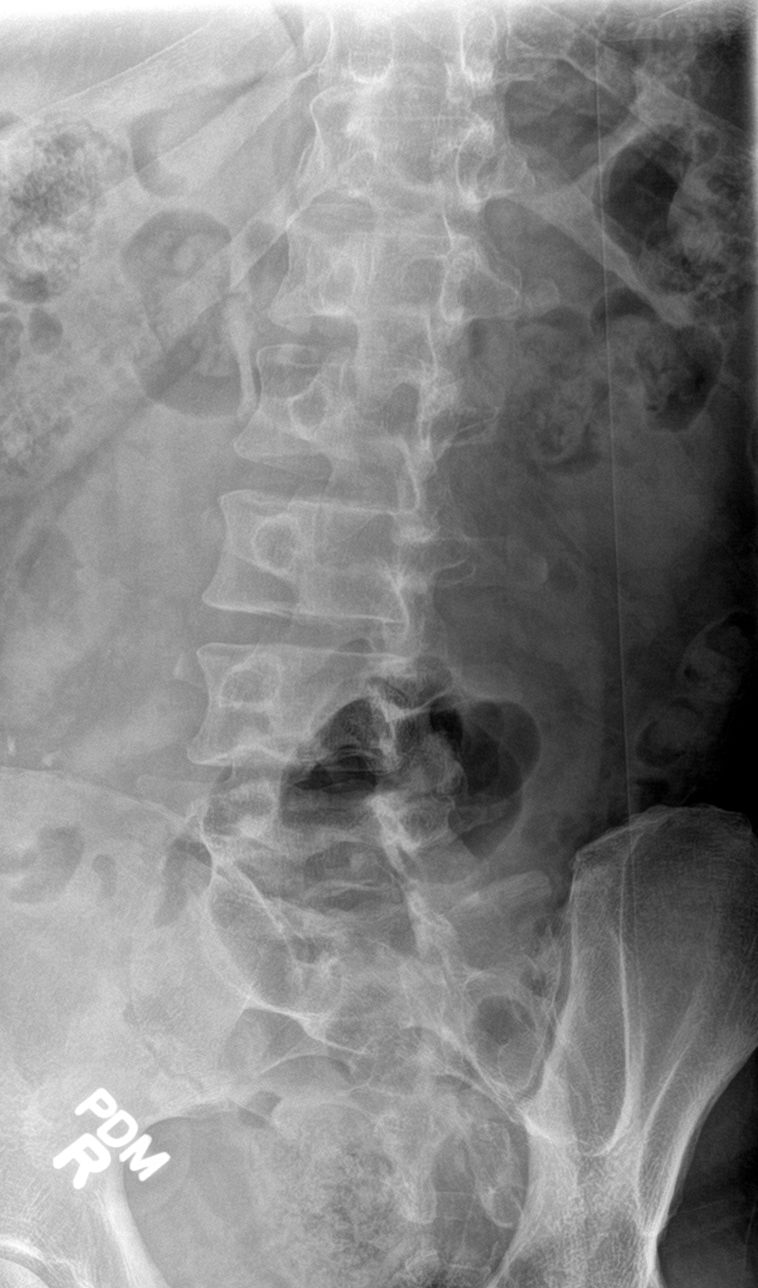

[l-spine obl (2 of 2)]
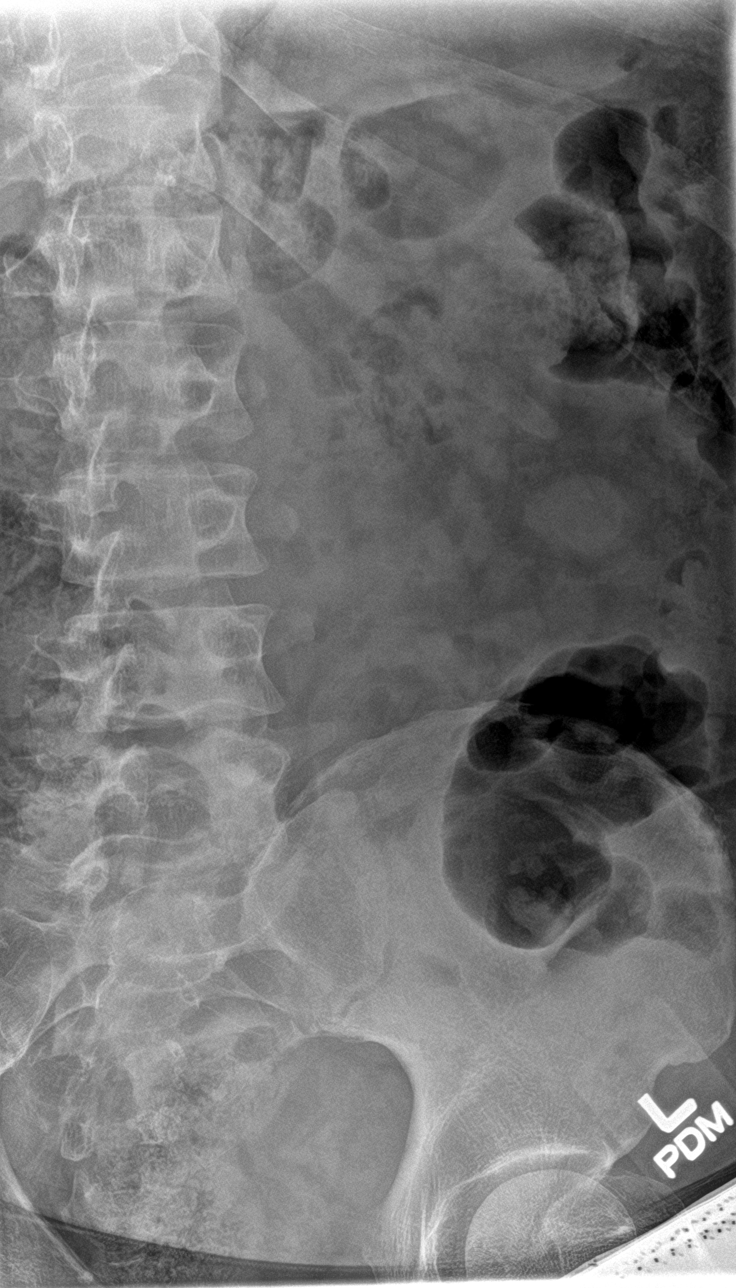

[l-spine lat]
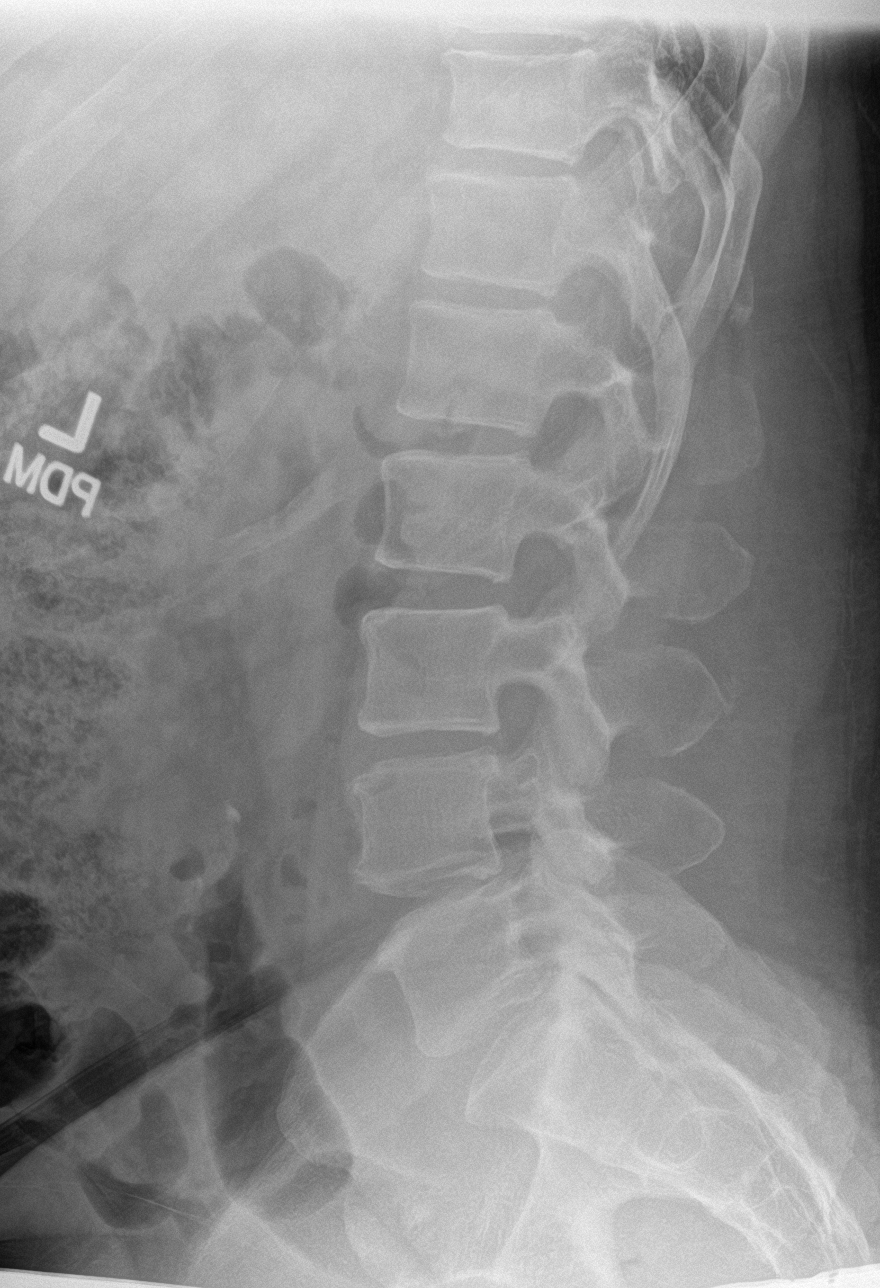

[l-spine spot]
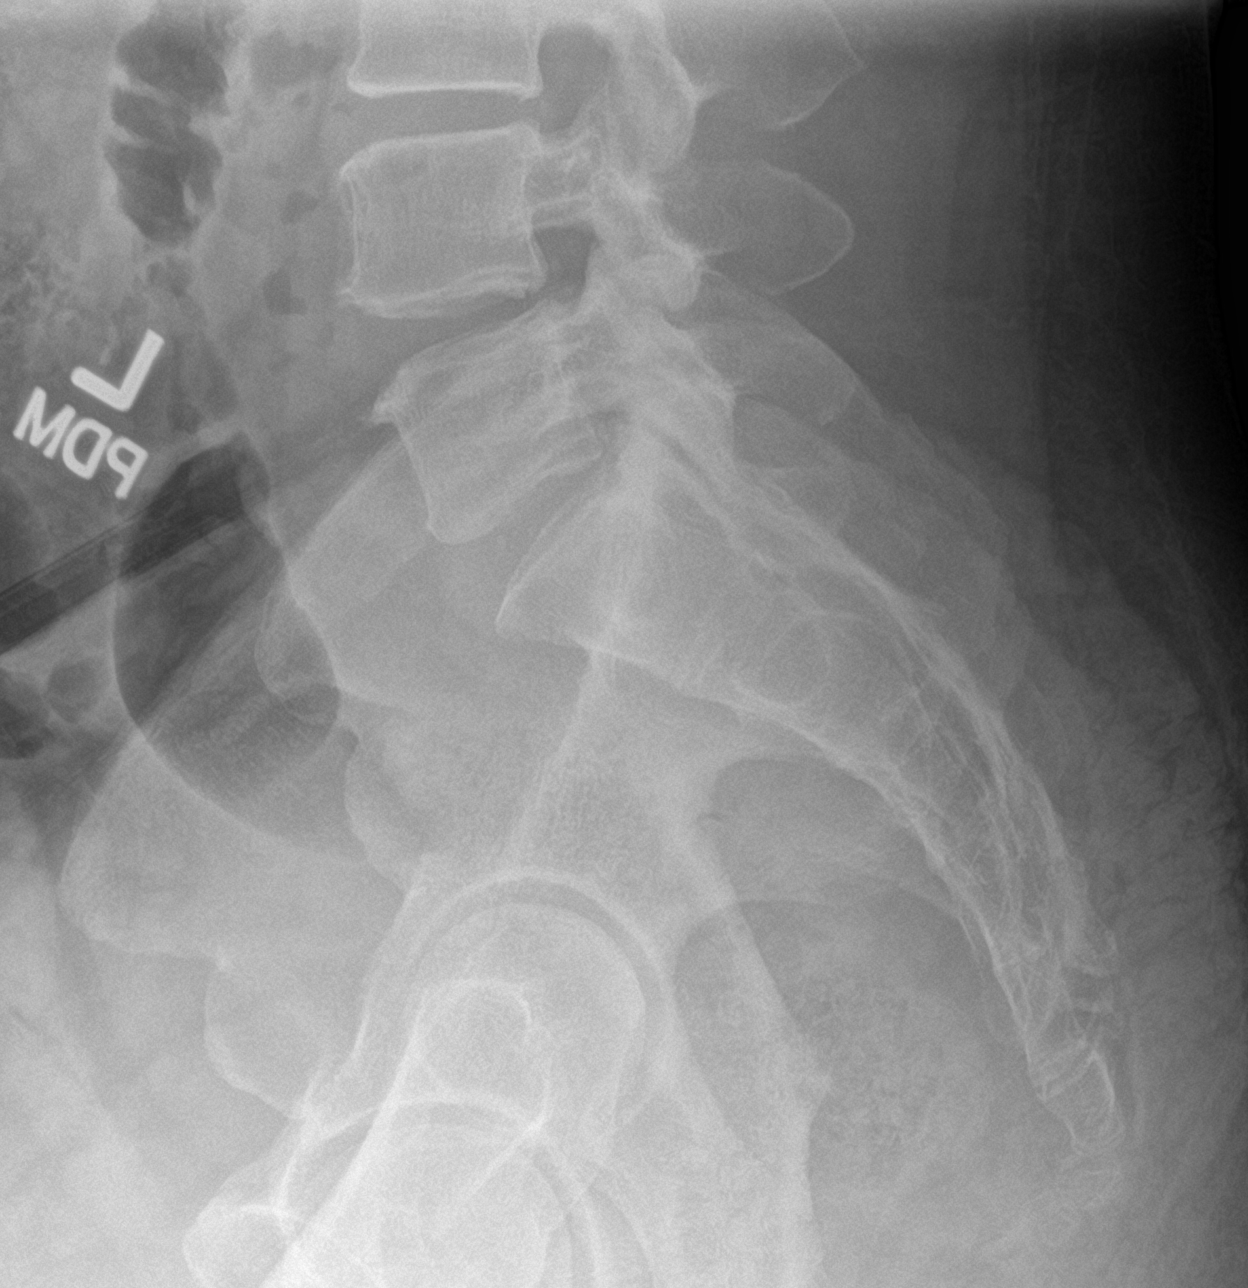

[5 of 5 positions shown; findings below may reference images not displayed]

FINDINGS: No acute bony abnormality identified. Degenerative changes lumbar
spine. Mild degenerative changes have increased slightly from
12/10/2012 . Mild scoliosis lumbar spine. Stable calcifications
noted over the right abdomen.
IMPRESSION: Diffuse degenerative change lumbar spine with mild scoliosis.
Diffuse degenerative changes have increased slightly from 12/10/2012
. No acute abnormality .

## 2018-03-22 NOTE — Progress Notes (Deleted)
Referring Provider: Marrian Salvage,* Primary Care Physician:  Janith Lima, MD   Reason for Consultation:  Diarrhea and blood in the stool   IMPRESSION:  Bright red blood per rectum    - hemoglobin at time of symptoms 15.5. 02/04/18 Recent diarrhea Father with Crohn's disease  PLAN: Colonoscopy   HPI: Christopher Byrd is a 47 y.o. male seen in consultation at the request of NP [facility] for further evaluation of diarrhea and rectal bleeding.  History is obtained through the patient and review of his electronic health record.  Evaluated by Ms. Murray in August for the for 2 days of bright red blood per rectum and diarrhea.  Labs 02/04/2018 show a hemoglobin of 15.5, MCV of 89.7, RDW of 13.3, and BUN of 12.  Abdominal films February 04, 2017 showing nonobstructive bowel gas pattern.  The patient's father has Crohn's disease.   Past Medical History:  Diagnosis Date  . Hyperlipidemia     Past Surgical History:  Procedure Laterality Date  . APPENDECTOMY  2002    Prior to Admission medications   Medication Sig Start Date End Date Taking? Authorizing Provider  atorvastatin (LIPITOR) 40 MG tablet TAKE 1 TABLET BY MOUTH EVERY DAY 11/27/17   Janith Lima, MD  baclofen (LIORESAL) 10 MG tablet baclofen 10 mg tablet    [provider]  buPROPion (WELLBUTRIN SR) 150 MG 12 hr tablet Take 150 mg by mouth 2 (two) times daily. 11/16/17   [provider]  clonazePAM (KLONOPIN) 1 MG tablet clonazepam 1 mg tablet    [provider]  fluticasone (FLONASE) 50 MCG/ACT nasal spray  01/11/14   [provider]  loratadine (CLARITIN) 10 MG tablet Take 10 mg by mouth daily.    [provider]  naproxen sodium (ANAPROX) 550 MG tablet naproxen sodium 550 mg tablet    [provider]  ranitidine (ZANTAC) 150 MG tablet ranitidine 150 mg tablet    [provider]  traZODone (DESYREL) 50 MG tablet Take 200 mg by mouth at bedtime. 10/14/17    [provider]    Current Outpatient Medications  Medication Sig Dispense Refill  . atorvastatin (LIPITOR) 40 MG tablet TAKE 1 TABLET BY MOUTH EVERY DAY 90 tablet 1  . baclofen (LIORESAL) 10 MG tablet baclofen 10 mg tablet    . buPROPion (WELLBUTRIN SR) 150 MG 12 hr tablet Take 150 mg by mouth 2 (two) times daily.  5  . clonazePAM (KLONOPIN) 1 MG tablet clonazepam 1 mg tablet    . fluticasone (FLONASE) 50 MCG/ACT nasal spray     . loratadine (CLARITIN) 10 MG tablet Take 10 mg by mouth daily.    . naproxen sodium (ANAPROX) 550 MG tablet naproxen sodium 550 mg tablet    . ranitidine (ZANTAC) 150 MG tablet ranitidine 150 mg tablet    . traZODone (DESYREL) 50 MG tablet Take 200 mg by mouth at bedtime.  1   No current facility-administered medications for this visit.     Allergies as of 03/24/2018 - Review Complete 02/04/2018  Allergen Reaction Noted  . Simvastatin  08/09/2011    Family History  Problem Relation Age of Onset  . Diabetes Other   . Hypertension Other   . Cancer Other        Prostate and Breast Cancer  . Alcohol abuse Neg Hx   . Drug abuse Neg Hx   . Heart disease Neg Hx   . Hyperlipidemia Neg Hx   .  Stroke Neg Hx   . Breast cancer Mother   . Hypertension Father   . Diabetes Father   . Sickle cell anemia Brother     Social History   Socioeconomic History  . Marital status: Married    Spouse name: Not on file  . Number of children: Not on file  . Years of education: Not on file  . Highest education level: Not on file  Occupational History  . Not on file  Social Needs  . Financial resource strain: Not on file  . Food insecurity:    Worry: Not on file    Inability: Not on file  . Transportation needs:    Medical: Not on file    Non-medical: Not on file  Tobacco Use  . Smoking status: Never Smoker  . Smokeless tobacco: Never Used  Substance and Sexual Activity  . Alcohol use: Yes    Alcohol/week: 5.0 standard drinks    Types: 5 Cans of  beer per week  . Drug use: No  . Sexual activity: Yes  Lifestyle  . Physical activity:    Days per week: Not on file    Minutes per session: Not on file  . Stress: Not on file  Relationships  . Social connections:    Talks on phone: Not on file    Gets together: Not on file    Attends religious service: Not on file    Active member of club or organization: Not on file    Attends meetings of clubs or organizations: Not on file    Relationship status: Not on file  . Intimate partner violence:    Fear of current or ex partner: Not on file    Emotionally abused: Not on file    Physically abused: Not on file    Forced sexual activity: Not on file  Other Topics Concern  . Not on file  Social History Narrative   Caffienated drinks-no   Seat belt use often-yes   Regular Exercise-yes   Smoke alarm in the home-yes   Firearms/guns in the home-yes   History of physical abuse-no                Review of Systems: 12 system ROS is negative except as noted above.   Physical Exam: Vital signs in last 24 hours: @VSRANGES @   General:   Alert,  Well-developed, well-nourished, pleasant and cooperative in NAD Head:  Normocephalic and atraumatic. Eyes:  Sclera clear, no icterus.   Conjunctiva pink. Ears:  Normal auditory acuity. Nose:  No deformity, discharge,  or lesions. Mouth:  No deformity or lesions.   Neck:  Supple; no masses or thyromegaly. Lungs:  Clear throughout to auscultation.   No wheezes, crackles, or rhonchi. Heart:  Regular rate and rhythm; no murmurs, clicks, rubs,  or gallops. Abdomen:  Soft,nontender, BS active,nonpalp mass or hsm.   Rectal:  Deferred  Msk:  Symmetrical without gross deformities. . Pulses:  Normal pulses noted. Extremities:  Without clubbing or edema. Neurologic:  Alert and  oriented x4;  grossly normal neurologically. Skin:  Intact without significant lesions or rashes.. Psych:  Alert and cooperative. Normal mood and affect.  Intake/Output  from previous day: No intake/output data recorded. Intake/Output this shift: @IOTHISSHIFT @  Lab Results: No results for input(s): WBC, HGB, HCT, PLT in the last 72 hours. BMET No results for input(s): NA, K, CL, CO2, GLUCOSE, BUN, CREATININE, CALCIUM in the last 72 hours. LFT No results for input(s): PROT, ALBUMIN, AST,  ALT, ALKPHOS, BILITOT, BILIDIR, IBILI in the last 72 hours. PT/INR No results for input(s): LABPROT, INR in the last 72 hours. Hepatitis Panel No results for input(s): HEPBSAG, HCVAB, HEPAIGM, HEPBIGM in the last 72 hours.    Studies/Results: No results found.    Thornton Park  03/22/2018, 1:48 PM

## 2018-03-24 ENCOUNTER — Ambulatory Visit: Payer: Self-pay | Admitting: Gastroenterology

## 2018-04-22 ENCOUNTER — Encounter

## 2018-04-22 ENCOUNTER — Ambulatory Visit: Payer: Self-pay | Admitting: Gastroenterology

## 2018-05-22 ENCOUNTER — Encounter: Payer: Self-pay | Admitting: Gastroenterology

## 2018-05-22 ENCOUNTER — Ambulatory Visit: Payer: BLUE CROSS/BLUE SHIELD | Admitting: Gastroenterology

## 2018-05-22 VITALS — BP 126/80 | HR 74 | Ht 69.0 in | Wt 243.2 lb

## 2018-05-22 DIAGNOSIS — K625 Hemorrhage of anus and rectum: Secondary | ICD-10-CM

## 2018-05-22 MED ORDER — NA SULFATE-K SULFATE-MG SULF 17.5-3.13-1.6 GM/177ML PO SOLN: 1.0000 | ORAL | 0 refills | Status: DC

## 2018-05-22 NOTE — Patient Instructions (Signed)

## 2018-05-22 NOTE — Progress Notes (Signed)
Referring Provider: Marrian Salvage,* Primary Care Physician:  Janith Lima, MD   Reason for Consultation: Rectal bleeding   IMPRESSION:  Rectal bleeding No prior colon cancer screening No known family history of colon polyps or cancer  The differential for painless rectal bleeding is broad.  It includes outlet sources such as fissure or hemorrhoids, as well as polyps, mass, ulcers, and colitis.  Given this differential I am recommending a colonoscopy.  PLAN: Colonoscopy  I consented the patient at the bedside today discussing the risks, benefits, and alternatives to endoscopic evaluation. In particular, we discussed the risks that include, but are not limited to, reaction to medication, cardiopulmonary compromise, bleeding requiring blood transfusion, aspiration resulting in pneumonia, perforation requiring surgery, and even death. We reviewed the risk of missed lesion including polyps or even cancer. The patient acknowledges these risks and asks that we proceed.   HPI: Christopher Byrd is a 47 y.o. male for rectal bleeding. The history is obtained through the patient and review of his electronic health record.  Four to five months ago he noticed rare blood in the stool. Then it seemed to occur more frequently. Associated diarrhea. Urgency. No preceding constipation or straining. No abdominal pain or tenesmus. No rectal pain.  Things are now back to normal. Stools are at baseline. No prior rectal bleeding, hemorrhoids, or anal surgery. No prior endoscopic evaluation or colon cancer screening.   No other associated symptoms. No identified exacerbating or relieving features.   Seen by his primary care provider who arranged for referral to GI.  Abdominal films 02/05/18 were obtained when the symptoms started and showed a nonobstructive bowel gas pattern. Mild stool burden.   Father with Crohn's disease. No known family history of colon cancer or polyps. No other family history  of colitis.    Past Medical History:  Diagnosis Date  . GERD (gastroesophageal reflux disease)   . Hyperlipidemia   . PTSD (post-traumatic stress disorder)     Past Surgical History:  Procedure Laterality Date  . APPENDECTOMY  2002  . HAND SURGERY Left     Current Outpatient Medications  Medication Sig Dispense Refill  . atorvastatin (LIPITOR) 40 MG tablet TAKE 1 TABLET BY MOUTH EVERY DAY 90 tablet 1  . baclofen (LIORESAL) 10 MG tablet baclofen 10 mg tablet    . buPROPion (WELLBUTRIN SR) 150 MG 12 hr tablet Take 150 mg by mouth 2 (two) times daily.  5  . clonazePAM (KLONOPIN) 1 MG tablet clonazepam 1 mg tablet    . fluticasone (FLONASE) 50 MCG/ACT nasal spray Place 1 spray into both nostrils daily.     Marland Kitchen loratadine (CLARITIN) 10 MG tablet Take 10 mg by mouth daily.    . naproxen sodium (ANAPROX) 550 MG tablet naproxen sodium 550 mg tablet    . ranitidine (ZANTAC) 150 MG tablet ranitidine 150 mg tablet    . traZODone (DESYREL) 50 MG tablet Take 200 mg by mouth at bedtime.  1  . Na Sulfate-K Sulfate-Mg Sulf (SUPREP BOWEL PREP KIT) 17.5-3.13-1.6 GM/177ML SOLN Take 1 kit by mouth as directed. 324 mL 0   No current facility-administered medications for this visit.     Allergies as of 05/22/2018 - Review Complete 05/22/2018  Allergen Reaction Noted  . Simvastatin  08/09/2011    Family History  Problem Relation Age of Onset  . Breast cancer Mother   . Hypertension Father   . Diabetes Father   . Prostate cancer Father   . Crohn's  disease Father   . Sickle cell anemia Brother   . Diabetes Other   . Hypertension Other   . Cancer Other        Prostate and Breast Cancer  . Alcohol abuse Neg Hx   . Drug abuse Neg Hx   . Heart disease Neg Hx   . Hyperlipidemia Neg Hx   . Stroke Neg Hx     Social History   Socioeconomic History  . Marital status: Married    Spouse name: Not on file  . Number of children: 2  . Years of education: Not on file  . Highest education level:  Not on file  Occupational History  . Not on file  Social Needs  . Financial resource strain: Not on file  . Food insecurity:    Worry: Not on file    Inability: Not on file  . Transportation needs:    Medical: Not on file    Non-medical: Not on file  Tobacco Use  . Smoking status: Never Smoker  . Smokeless tobacco: Never Used  Substance and Sexual Activity  . Alcohol use: Yes    Alcohol/week: 5.0 standard drinks    Types: 5 Cans of beer per week    Comment: daily  . Drug use: No  . Sexual activity: Yes    Partners: Female  Lifestyle  . Physical activity:    Days per week: Not on file    Minutes per session: Not on file  . Stress: Not on file  Relationships  . Social connections:    Talks on phone: Not on file    Gets together: Not on file    Attends religious service: Not on file    Active member of club or organization: Not on file    Attends meetings of clubs or organizations: Not on file    Relationship status: Not on file  . Intimate partner violence:    Fear of current or ex partner: Not on file    Emotionally abused: Not on file    Physically abused: Not on file    Forced sexual activity: Not on file  Other Topics Concern  . Not on file  Social History Narrative   Caffienated drinks-no   Seat belt use often-yes   Regular Exercise-yes   Smoke alarm in the home-yes   Firearms/guns in the home-yes   History of physical abuse-no                Review of Systems: 12 system ROS is negative except as noted above.  Filed Weights   05/22/18 1005  Weight: 243 lb 4 oz (110.3 kg)    Physical Exam: Vital signs were reviewed. General:   Alert, well-nourished, pleasant and cooperative in NAD Head:  Normocephalic and atraumatic. Eyes:  Sclera clear, no icterus.   Conjunctiva pink. Mouth:  No deformity or lesions.   Neck:  Supple; no thyromegaly. Lungs:  Clear throughout to auscultation.   No wheezes.  Heart:  Regular rate and rhythm; no murmurs Abdomen:   Soft, nontender, normal bowel sounds. No rebound or guarding. No hepatosplenomegaly Rectal:   No chemical dermatitis. No external hemorrhoids, fissure or fistula. No prolapsing hemorrhoids. No rectal prolapse. Normal anocutaneous reflex. No stool in the rectal vault. No mass or fecal impaction. Normal anal resting tone.  Msk:  Symmetrical without gross deformities. Extremities:  No gross deformities or edema. Neurologic:  Alert and  oriented x4;  grossly nonfocal Skin:  No rash or bruise.  Psych:  Alert and cooperative. Normal mood and affect.   Ellias Mcelreath L. Tarri Glenn, MD, MPH [facility] 05/25/2018, 5:14 PM

## 2018-05-25 ENCOUNTER — Encounter: Payer: Self-pay | Admitting: Gastroenterology

## 2018-05-26 ENCOUNTER — Other Ambulatory Visit: Payer: Self-pay | Admitting: Internal Medicine

## 2018-05-28 ENCOUNTER — Encounter: Payer: Self-pay | Admitting: Podiatry

## 2018-05-28 ENCOUNTER — Ambulatory Visit (INDEPENDENT_AMBULATORY_CARE_PROVIDER_SITE_OTHER): Payer: BLUE CROSS/BLUE SHIELD | Admitting: Podiatry

## 2018-05-28 DIAGNOSIS — M216X9 Other acquired deformities of unspecified foot: Secondary | ICD-10-CM

## 2018-05-28 DIAGNOSIS — M2142 Flat foot [pes planus] (acquired), left foot: Secondary | ICD-10-CM | POA: Diagnosis not present

## 2018-05-28 DIAGNOSIS — M722 Plantar fascial fibromatosis: Secondary | ICD-10-CM

## 2018-05-28 DIAGNOSIS — M2141 Flat foot [pes planus] (acquired), right foot: Secondary | ICD-10-CM

## 2018-06-16 ENCOUNTER — Telehealth: Payer: Self-pay | Admitting: Podiatry

## 2018-06-16 NOTE — Telephone Encounter (Signed)
Left message for pt to call with his questions and I would in most cases be able to call with answers.

## 2018-06-16 NOTE — Telephone Encounter (Signed)
I'm a patient of Dr. Eleanora Neighbor and I have some questions. If I could get a call back at [phone].

## 2018-06-17 ENCOUNTER — Telehealth: Payer: Self-pay | Admitting: Podiatry

## 2018-06-17 NOTE — Telephone Encounter (Signed)
Left message to call with questions and I would call back with information.

## 2018-06-17 NOTE — Telephone Encounter (Signed)
This is Bricen Victory at [phone]. I look forward to hearing from you.

## 2018-06-17 NOTE — Telephone Encounter (Signed)
Left message 06/17/2018 Telephone Call to call with questions.

## 2018-06-17 NOTE — Telephone Encounter (Signed)
Pt called a couple of weeks ago and he needed a statement from Dr. March Rummage concerning the plantar fascia and how it may affect the back or be affected by the back. Pt states Dr. March Rummage can call him if he needs more information.

## 2018-06-25 ENCOUNTER — Ambulatory Visit: Payer: BLUE CROSS/BLUE SHIELD | Admitting: Podiatry

## 2018-06-25 DIAGNOSIS — M722 Plantar fascial fibromatosis: Secondary | ICD-10-CM

## 2018-06-25 DIAGNOSIS — M778 Other enthesopathies, not elsewhere classified: Secondary | ICD-10-CM

## 2018-06-25 DIAGNOSIS — M779 Enthesopathy, unspecified: Secondary | ICD-10-CM | POA: Diagnosis not present

## 2018-06-25 NOTE — Progress Notes (Signed)
  Subjective:  Patient ID: Christopher Byrd, male    DOB: 07-10-1970,  MRN: [id number]  Chief Complaint  Patient presents with  . Plantar Fasciitis    3 week follow up - doing better but still not 100% back to normal    48 y.o. male presents with the above complaint.  Reports pain in the left heel states the right heel is doing okay but still not 100%.  States the left heel was hurting as long as the right one has been hurting but has been hurting more recently.  Review of Systems: Negative except as noted in the HPI. Denies N/V/F/Ch.  Past Medical History:  Diagnosis Date  . GERD (gastroesophageal reflux disease)   . Hyperlipidemia   . PTSD (post-traumatic stress disorder)     Current Outpatient Medications:  .  atorvastatin (LIPITOR) 40 MG tablet, TAKE 1 TABLET BY MOUTH EVERY DAY, Disp: 90 tablet, Rfl: 1 .  baclofen (LIORESAL) 10 MG tablet, baclofen 10 mg tablet, Disp: , Rfl:  .  buPROPion (WELLBUTRIN SR) 150 MG 12 hr tablet, Take 150 mg by mouth 2 (two) times daily., Disp: , Rfl: 5 .  clonazePAM (KLONOPIN) 1 MG tablet, clonazepam 1 mg tablet, Disp: , Rfl:  .  fluticasone (FLONASE) 50 MCG/ACT nasal spray, Place 1 spray into both nostrils daily. , Disp: , Rfl:  .  loratadine (CLARITIN) 10 MG tablet, Take 10 mg by mouth daily., Disp: , Rfl:  .  Na Sulfate-K Sulfate-Mg Sulf (SUPREP BOWEL PREP KIT) 17.5-3.13-1.6 GM/177ML SOLN, Take 1 kit by mouth as directed., Disp: 324 mL, Rfl: 0 .  naproxen sodium (ANAPROX) 550 MG tablet, naproxen sodium 550 mg tablet, Disp: , Rfl:  .  ranitidine (ZANTAC) 150 MG tablet, ranitidine 150 mg tablet, Disp: , Rfl:  .  traZODone (DESYREL) 50 MG tablet, Take 200 mg by mouth at bedtime., Disp: , Rfl: 1  Social History   Tobacco Use  Smoking Status Never Smoker  Smokeless Tobacco Never Used    Allergies  Allergen Reactions  . Simvastatin     Muscle aches   Objective:  There were no vitals filed for this visit. There is no height or weight on file  to calculate BMI. Constitutional Well developed. Well nourished.  Vascular Dorsalis pedis pulses palpable bilaterally. Posterior tibial pulses palpable bilaterally. Capillary refill normal to all digits.  No cyanosis or clubbing noted. Pedal hair growth normal.  Neurologic Normal speech. Oriented to person, place, and time. Epicritic sensation to light touch grossly present bilaterally.  Dermatologic Nails well groomed and normal in appearance. No open wounds. No skin lesions.  Orthopedic: Normal joint ROM without pain or crepitus bilaterally. No visible deformities. Tender to palpation at the calcaneal tuber left. No pain with calcaneal squeeze left. Ankle ROM diminished range of motion left. Silfverskiold Test: positive left.   Assessment:   1. Plantar fasciitis   2. Capsulitis of right foot    Plan:  Patient was evaluated and treated and all questions answered.  Capsulitis right foot, plantar fasciitis -Improving no pain today no injection warranted today  Plantar Fasciitis, left - Injection delivered to the plantar fascia as below.  Procedure: Injection Tendon/Ligament Location: Left plantar fascia at the glabrous junction; medial approach. Skin Prep: alcohol Injectate: 1 cc 0.5% marcaine plain, 1 cc dexamethasone phosphate, 0.5 cc kenalog 10. Disposition: Patient tolerated procedure well. Injection site dressed with a band-aid.  Return in about 6 weeks (around 08/06/2018) for Plantar fasciitis, Bilateral.

## 2018-06-28 NOTE — Progress Notes (Signed)
  Subjective:  Patient ID: Christopher Byrd, male    DOB: [birth year],  MRN: [id number]  Chief Complaint  Patient presents with  . Plantar Fasciitis    right worse than left - has some forms to fill out for work to "rate" his feet   48 y.o. male returns for the above complaint.  Doing better states that the injections have helped but the right is still hurting.   Objective:  There were no vitals filed for this visit. General AA&O x3. Normal mood and affect.  Vascular Pedal pulses palpable.  Neurologic Epicritic sensation grossly intact.  Dermatologic No open lesions. Skin normal texture and turgor.  Orthopedic: Pain to palpation about the medial calcaneal tuber right Pes planus bilaterally.   Assessment & Plan:  Patient was evaluated and treated and all questions answered.  Plantar Fasciitis, right - Improving - Continue CMOS. -The discussed with patient that should he have specific forms for me to fill out a form of the best my ability No follow-ups on file.

## 2018-07-02 ENCOUNTER — Ambulatory Visit: Payer: BLUE CROSS/BLUE SHIELD | Admitting: Podiatry

## 2018-07-09 ENCOUNTER — Encounter: Payer: Self-pay | Admitting: Gastroenterology

## 2018-08-05 ENCOUNTER — Encounter: Payer: Self-pay | Admitting: Internal Medicine

## 2018-08-05 ENCOUNTER — Ambulatory Visit: Payer: BLUE CROSS/BLUE SHIELD | Admitting: Internal Medicine

## 2018-08-05 VITALS — BP 122/70 | HR 64 | Temp 98.2°F | Resp 16 | Ht 69.0 in | Wt 231.0 lb

## 2018-08-05 DIAGNOSIS — M5416 Radiculopathy, lumbar region: Secondary | ICD-10-CM | POA: Diagnosis not present

## 2018-08-05 DIAGNOSIS — M5136 Other intervertebral disc degeneration, lumbar region: Secondary | ICD-10-CM

## 2018-08-05 DIAGNOSIS — M2341 Loose body in knee, right knee: Secondary | ICD-10-CM | POA: Insufficient documentation

## 2018-08-05 NOTE — [place] (Signed)
Subjective:  Patient ID: Christopher Byrd, male    DOB: 10-05-1970  Age: 48 y.o. MRN: [id number]  CC: Osteoarthritis and Back Pain   HPI Christopher Byrd presents for concerns about low back pain.  He has had worsening low back pain over the last 20 years.  He is being treated at the [facility] with epidural steroid injections.  His last epidural steroid was about 2 weeks ago and he says it did not help.  He gets some relief from the back pain with naproxen.  He describes a sharp intermittent pain that radiates into his lower extremities.  He denies any paresthesias in his lower extremities.  He has not had an x-ray done of his back in 5 years.  He is considering seeing a neurosurgeon to discuss surgical options.  He also has chronic right knee pain and a prior films show that he had a loose body and degenerative changes in his right knee.  He wants to see an orthopedist to see if he needs to have a surgical procedure.  Outpatient Medications Prior to Visit  Medication Sig Dispense Refill  . atorvastatin (LIPITOR) 40 MG tablet TAKE 1 TABLET BY MOUTH EVERY DAY 90 tablet 1  . baclofen (LIORESAL) 10 MG tablet baclofen 10 mg tablet    . buPROPion (WELLBUTRIN SR) 150 MG 12 hr tablet Take 150 mg by mouth 2 (two) times daily.  5  . clonazePAM (KLONOPIN) 1 MG tablet clonazepam 1 mg tablet    . fluticasone (FLONASE) 50 MCG/ACT nasal spray Place 1 spray into both nostrils daily.     Marland Kitchen loratadine (CLARITIN) 10 MG tablet Take 10 mg by mouth daily.    . Na Sulfate-K Sulfate-Mg Sulf (SUPREP BOWEL PREP KIT) 17.5-3.13-1.6 GM/177ML SOLN Take 1 kit by mouth as directed. 324 mL 0  . naproxen sodium (ANAPROX) 550 MG tablet naproxen sodium 550 mg tablet    . ranitidine (ZANTAC) 150 MG tablet ranitidine 150 mg tablet    . traZODone (DESYREL) 50 MG tablet Take 200 mg by mouth at bedtime.  1   No facility-administered medications prior to visit.     ROS Review of Systems  Constitutional: Negative.   HENT: Negative.    Eyes: Negative for visual disturbance.  Respiratory: Negative for cough, chest tightness, shortness of breath and wheezing.   Gastrointestinal: Negative for abdominal pain, constipation, diarrhea, nausea and vomiting.  Genitourinary: Negative.  Negative for difficulty urinating.  Musculoskeletal: Positive for arthralgias and back pain.  Skin: Negative.   Neurological: Negative for dizziness, weakness and numbness.  Hematological: Negative for adenopathy. Does not bruise/bleed easily.  Psychiatric/Behavioral: Negative.     Objective:  BP 122/70 (BP Location: Left Arm, Patient Position: Sitting, Cuff Size: Large)   Pulse 64   Temp 98.2 F (36.8 C) (Oral)   Resp 16   Ht '5\' 9"'$  (1.753 m)   Wt 231 lb (104.8 kg)   SpO2 97%   BMI 34.11 kg/m   BP Readings from Last 3 Encounters:  08/05/18 122/70  05/22/18 126/80  02/04/18 124/82    Wt Readings from Last 3 Encounters:  08/05/18 231 lb (104.8 kg)  05/22/18 243 lb 4 oz (110.3 kg)  02/04/18 231 lb 1.3 oz (104.8 kg)    Physical Exam Vitals signs reviewed.  Constitutional:      Appearance: He is not ill-appearing or diaphoretic.  HENT:     Mouth/Throat:     Mouth: Mucous membranes are moist.     Pharynx:  Oropharynx is clear. No oropharyngeal exudate.  Eyes:     General: No scleral icterus.    Conjunctiva/sclera: Conjunctivae normal.  Neck:     Musculoskeletal: Normal range of motion and neck supple.  Cardiovascular:     Rate and Rhythm: Normal rate and regular rhythm.     Heart sounds: No murmur. No gallop.   Pulmonary:     Effort: Pulmonary effort is normal. No respiratory distress.     Breath sounds: No stridor. No wheezing, rhonchi or rales.  Abdominal:     General: Abdomen is flat. Bowel sounds are normal.     Palpations: There is no mass.     Tenderness: There is no abdominal tenderness. There is no guarding.  Musculoskeletal:        General: No swelling.     Right knee: Normal. He exhibits normal range of motion,  no swelling, no effusion, no deformity and no erythema. No tenderness found.     Lumbar back: He exhibits no tenderness, no bony tenderness, no edema and no deformity.     Right lower leg: No edema.     Left lower leg: No edema.  Skin:    General: Skin is warm and dry.     Coloration: Skin is not pale.     Findings: No erythema or rash.  Neurological:     General: No focal deficit present.     Mental Status: He is oriented to person, place, and time. Mental status is at baseline.     Motor: No weakness.     Gait: Gait normal.     Deep Tendon Reflexes: Reflexes normal.     Reflex Scores:      Tricep reflexes are 1+ on the right side and 1+ on the left side.      Bicep reflexes are 1+ on the right side and 1+ on the left side.      Brachioradialis reflexes are 1+ on the right side and 1+ on the left side.      Patellar reflexes are 1+ on the right side and 1+ on the left side.      Achilles reflexes are 1+ on the right side and 1+ on the left side.    Comments: + SLR in BLE     Lab Results  Component Value Date   WBC 11.2 (H) 02/04/2018   HGB 15.5 02/04/2018   HCT 45.4 02/04/2018   PLT 199.0 02/04/2018   GLUCOSE 93 02/04/2018   CHOL 149 02/24/2017   TRIG 103.0 02/24/2017   HDL 48.70 02/24/2017   LDLCALC 80 02/24/2017   ALT 19 02/04/2018   AST 15 02/04/2018   NA 137 02/04/2018   K 4.0 02/04/2018   CL 102 02/04/2018   CREATININE 1.14 02/04/2018   BUN 12 02/04/2018   CO2 25 02/04/2018   TSH 1.68 02/24/2017   PSA 0.24 02/24/2017   MICROALBUR 7.2 (H) 12/05/2017    Dg Abd 2 Views  Result Date: 02/05/2018 CLINICAL DATA:  48 year old male with a history of diarrhea and blood in the stool EXAM: ABDOMEN - 2 VIEW COMPARISON:  12/10/2012, 12/04/2015 FINDINGS: Gas within stomach, small bowel, colon. No abnormal distention. Formed stool within the right colon, hepatic flexure. No radiopaque foreign body. No unexpected soft tissue densities. Similar appearance of calcifications  within the low right abdomen, unchanged from the comparison plain film studies. No air-fluid levels. No displaced fracture. Pelvic phleboliths. IMPRESSION: Nonobstructive bowel gas pattern. Mild stool burden. Electronically Signed  By: Corrie Mckusick D.O.   On: 02/05/2018 08:19    Assessment & Plan:   Harce was seen today for osteoarthritis and back pain.  Diagnoses and all orders for this visit:  DDD (degenerative disc disease), lumbar- He will undergo an MRI to see if he has degenerative changes that are causing his neurological symptoms. -     MR LUMBAR SPINE WO CONTRAST; Future  Lumbar radiculitis- I have asked him to undergo an MRI of the lumbar spine to see if there is spinal stenosis, nerve impingement, disc herniation, tumor, or mass. -     MR LUMBAR SPINE WO CONTRAST; Future  Loose body of right knee- He will see orthopedics about this.   I am having Jovi Zavadil. Escoe maintain his fluticasone, loratadine, atorvastatin, traZODone, ranitidine, clonazePAM, buPROPion, baclofen, naproxen sodium, and Na Sulfate-K Sulfate-Mg Sulf.  No orders of the defined types were placed in this encounter.    Follow-up: Return in about 3 months (around 11/03/2018).  Scarlette Calico, MD

## 2018-08-05 NOTE — Patient Instructions (Signed)

## 2018-08-07 ENCOUNTER — Encounter: Payer: Self-pay | Admitting: Gastroenterology

## 2018-08-07 ENCOUNTER — Other Ambulatory Visit: Payer: Self-pay

## 2018-08-07 ENCOUNTER — Ambulatory Visit (AMBULATORY_SURGERY_CENTER): Payer: Self-pay | Admitting: *Deleted

## 2018-08-07 VITALS — Ht 69.0 in | Wt 232.0 lb

## 2018-08-07 DIAGNOSIS — K625 Hemorrhage of anus and rectum: Secondary | ICD-10-CM

## 2018-08-07 NOTE — [place] Notes (Signed)
No egg or soy allergy known to patient  No issues with past sedation with any surgeries  or procedures, no intubation problems  No diet pills per patient No home 02 use per patient  No blood thinners per patient  Pt denies issues with constipation  No A fib or A flutter  EMMI video sent to pt's e mail  

## 2018-08-09 ENCOUNTER — Ambulatory Visit
Admission: RE | Admit: 2018-08-09 | Discharge: 2018-08-09 | Disposition: A | Discharge status: Home / Self Care | Payer: BLUE CROSS/BLUE SHIELD | Source: Ambulatory Visit | Attending: Internal Medicine | Admitting: Internal Medicine

## 2018-08-09 DIAGNOSIS — M5416 Radiculopathy, lumbar region: Secondary | ICD-10-CM

## 2018-08-09 DIAGNOSIS — M5136 Other intervertebral disc degeneration, lumbar region: Secondary | ICD-10-CM

## 2018-08-10 ENCOUNTER — Encounter: Payer: Self-pay | Admitting: Internal Medicine

## 2018-08-10 ENCOUNTER — Other Ambulatory Visit: Payer: Self-pay | Admitting: Internal Medicine

## 2018-08-10 DIAGNOSIS — M5106 Intervertebral disc disorders with myelopathy, lumbar region: Secondary | ICD-10-CM | POA: Insufficient documentation

## 2018-08-11 ENCOUNTER — Telehealth: Payer: Self-pay | Admitting: Internal Medicine

## 2018-08-11 NOTE — Telephone Encounter (Signed)
Copied from [place] [phone]. Topic: General - Other >> Aug 11, 2018 11:13 AM Leward Quan A wrote: Reason for CRM: Patient called back to inform Dr Ronnald Ramp that yes he would like to go ahead and see a Neuro Surgeon as suggested by Dr Ronnald Ramp. Patient can be reached at Ph# [phone]

## 2018-08-11 NOTE — Telephone Encounter (Signed)
Referral has already been entered per [facility] request.

## 2018-08-13 ENCOUNTER — Ambulatory Visit: Payer: Self-pay | Admitting: Internal Medicine

## 2018-08-20 ENCOUNTER — Other Ambulatory Visit: Payer: Self-pay

## 2018-08-20 ENCOUNTER — Encounter: Payer: Self-pay | Admitting: Gastroenterology

## 2018-08-20 ENCOUNTER — Ambulatory Visit (AMBULATORY_SURGERY_CENTER): Payer: BLUE CROSS/BLUE SHIELD | Admitting: Gastroenterology

## 2018-08-20 VITALS — BP 114/63 | HR 48 | Temp 97.3°F | Resp 15 | Ht 69.0 in | Wt 231.0 lb

## 2018-08-20 DIAGNOSIS — D124 Benign neoplasm of descending colon: Secondary | ICD-10-CM

## 2018-08-20 DIAGNOSIS — D128 Benign neoplasm of rectum: Secondary | ICD-10-CM

## 2018-08-20 DIAGNOSIS — K635 Polyp of colon: Secondary | ICD-10-CM | POA: Diagnosis not present

## 2018-08-20 DIAGNOSIS — K621 Rectal polyp: Secondary | ICD-10-CM

## 2018-08-20 DIAGNOSIS — K625 Hemorrhage of anus and rectum: Secondary | ICD-10-CM

## 2018-08-20 MED ORDER — SODIUM CHLORIDE 0.9 % IV SOLN: 500.0000 mL | Freq: Once | INTRAVENOUS | Status: DC

## 2018-08-20 NOTE — Op Note (Signed)
[place] Patient Name: Christopher Byrd Procedure Date: 08/20/2018 1:55 PM MRN: [id number] Endoscopist: Thornton Park MD, MD Age: 48 Referring MD:  Date of Birth: 08-Feb-1971 Gender: Male Account #: [id number] Procedure:                Colonoscopy Indications:              Rectal bleeding. First colonoscopy. No known family                            history of colon cancer or polyps. Medicines:                See the Anesthesia note for documentation of the                            administered medications Procedure:                Pre-Anesthesia Assessment:                           - Prior to the procedure, a History and Physical                            was performed, and patient medications and                            allergies were reviewed. The patient's tolerance of                            previous anesthesia was also reviewed. The risks                            and benefits of the procedure and the sedation                            options and risks were discussed with the patient.                            All questions were answered, and informed consent                            was obtained. Prior Anticoagulants: The patient has                            taken no previous anticoagulant or antiplatelet                            agents. ASA Grade Assessment: II - A patient with                            mild systemic disease. After reviewing the risks                            and benefits, the patient was deemed in  satisfactory condition to undergo the procedure.                           After obtaining informed consent, the colonoscope                            was passed under direct vision. Throughout the                            procedure, the patient's blood pressure, pulse, and                            oxygen saturations were monitored continuously. The                            Colonoscope was  introduced through the anus and                            advanced to the the terminal ileum, with                            identification of the appendiceal orifice and IC                            valve. The colonoscopy was performed without                            difficulty. The patient tolerated the procedure                            well. The quality of the bowel preparation was                            excellent. The terminal ileum, ileocecal valve,                            appendiceal orifice, and rectum were photographed. Scope In: 2:10:43 PM Scope Out: 2:39:17 PM Scope Withdrawal Time: 0 hours 24 minutes 13 seconds  Total Procedure Duration: 0 hours 28 minutes 34 seconds  Findings:                 The perianal and digital rectal examinations were                            normal.                           Non-bleeding internal hemorrhoids were found. The                            hemorrhoids were small.                           A > 30 mm polyp was found in the proximal  descending colon located 50 cm from the anal verge.                            The polyp was pedunculated with a large, thick                            stalk. The polyp was biopsied with a cold forceps                            for histology. Area was tattooed with an injection                            of 3 mL of [place] ink.                           A 2 mm polyp was found in the sigmoid colon. The                            polyp was flat. The polyp was removed with a cold                            snare. Resection and retrieval were complete.                           A few diverticula were found in the sigmoid colon                            and descending colon.                           The exam was otherwise without abnormality on                            direct and retroflexion views. Complications:            No immediate complications. Estimated blood  loss:                            Minimal. Estimated Blood Loss:     Estimated blood loss was minimal. Impression:               - Non-bleeding internal hemorrhoids.                           - One 30 mm polyp in the proximal descending colon.                            Biopsied. Tattooed.                           - One 2 mm polyp in the sigmoid colon, removed with                            a cold snare. Resected and retrieved.                           -  Diverticulosis in the sigmoid colon and in the                            descending colon.                           - The examination was otherwise normal on direct                            and retroflexion views. Recommendation:           - Patient has a contact number available for                            emergencies. The signs and symptoms of potential                            delayed complications were discussed with the                            patient. Return to normal activities tomorrow.                            Written discharge instructions were provided to the                            patient.                           - Resume regular diet today.                           - Continue present medications.                           - Await pathology results.                           - Dr. Rush Landmark stepped into your procedure room                            during the procedure at my request to look at this                            large polyp. Repeat colonoscopy at the next                            available appointment with Dr. Rush Landmark at the                            hospital where we have equipment that will allow                            for an attempted resection that avoids surgery. Thornton Park MD, MD 08/20/2018 3:12:12 PM This report has been signed electronically.

## 2018-08-20 NOTE — [place] Notes (Signed)
To PACU, VSS. Report to [email or web address] 

## 2018-08-20 NOTE — Patient Instructions (Addendum)
  Thank you for allowing [place] to care for you today!  Await pathology results.    Resume previous diet and medications today.  Return to your normal activities tomorrow.     YOU HAD AN ENDOSCOPIC PROCEDURE TODAY AT [facility]:   Refer to the procedure report that was given to you for any specific questions about what was found during the examination.  If the procedure report does not answer your questions, please call your gastroenterologist to clarify.  If you requested that your care partner not be given the details of your procedure findings, then the procedure report has been included in a sealed envelope for you to review at your convenience later.  YOU SHOULD EXPECT: Some feelings of bloating in the abdomen. Passage of more gas than usual.  Walking can help get rid of the air that was put into your GI tract during the procedure and reduce the bloating. If you had a lower endoscopy (such as a colonoscopy or flexible sigmoidoscopy) you may notice spotting of blood in your stool or on the toilet paper. If you underwent a bowel prep for your procedure, you may not have a normal bowel movement for a few days.  Please Note:  You might notice some irritation and congestion in your nose or some drainage.  This is from the oxygen used during your procedure.  There is no need for concern and it should clear up in a day or so.  SYMPTOMS TO REPORT IMMEDIATELY:   Following lower endoscopy (colonoscopy or flexible sigmoidoscopy):  Excessive amounts of blood in the stool  Significant tenderness or worsening of abdominal pains  Swelling of the abdomen that is new, acute  Fever of 100F or higher    For urgent or emergent issues, a gastroenterologist can be reached at any hour by calling [phone].   DIET:  We do recommend a small meal at first, but then you may proceed to your regular diet.  Drink plenty of fluids but you should avoid alcoholic beverages for 24  hours.  ACTIVITY:  You should plan to take it easy for the rest of today and you should NOT DRIVE or use heavy machinery until tomorrow (because of the sedation medicines used during the test).    FOLLOW UP: Our staff will call the number listed on your records the next business day following your procedure to check on you and address any questions or concerns that you may have regarding the information given to you following your procedure. If we do not reach you, we will leave a message.  However, if you are feeling well and you are not experiencing any problems, there is no need to return our call.  We will assume that you have returned to your regular daily activities without incident.  If any biopsies were taken you will be contacted by phone or by letter within the next 1-3 weeks.  Please call us at [phone] if you have not heard about the biopsies in 3 weeks.    SIGNATURES/CONFIDENTIALITY: You and/or your care partner have signed paperwork which will be entered into your electronic medical record.  These signatures attest to the fact that that the information above on your After Visit Summary has been reviewed and is understood.  Full responsibility of the confidentiality of this discharge information lies with you and/or your care-partner.

## 2018-08-20 NOTE — [place] Notes (Signed)
Pt's states no medical or surgical changes since previsit or office visit. 

## 2018-08-20 NOTE — [place] (Signed)
Called to room to assist during endoscopic procedure.  Patient ID and intended procedure confirmed with present staff. Received instructions for my participation in the procedure from the performing physician.  

## 2018-08-21 ENCOUNTER — Telehealth: Payer: Self-pay | Admitting: *Deleted

## 2018-08-21 ENCOUNTER — Telehealth: Payer: Self-pay

## 2018-08-21 LAB — HM COLONOSCOPY

## 2018-08-21 NOTE — Telephone Encounter (Signed)
  Follow up Call-  Call back number 08/20/2018  Post procedure Call Back phone  # [phone]  Permission to leave phone message Yes  Some recent data might be hidden     Patient questions:  Do you have a fever, pain , or abdominal swelling? No. Pain Score  0 *  Have you tolerated food without any problems? Yes.    Have you been able to return to your normal activities? Yes.    Do you have any questions about your discharge instructions: Diet   No. Medications  No. Follow up visit  No.  Do you have questions or concerns about your Care? No.  Actions: * If pain score is 4 or above: No action needed, pain <4.

## 2018-08-21 NOTE — Telephone Encounter (Signed)
Christopher Byrd, Dr. Tarri Glenn did ask for me to come into the room to evaluate. I think this is a good case for potential resection. If I can see in clinic that would be ideal and he can be double-booked if necessary. I would look towards a date in April for the patient but we should see in clinic first to discuss. Thanks. GM

## 2018-08-21 NOTE — Telephone Encounter (Signed)
Dr Rush Landmark per Dr Woodward Ku procedure report from yesterday please advise as to scheduling.    "Dr. Rush Landmark stepped into your procedure room during the procedure at my request to look at this large polyp. Repeat colonoscopy at the next available appointment with Dr. Rush Landmark at the hospital where we have equipment that will allow for an attempted resection that avoids surgery."

## 2018-08-24 NOTE — Telephone Encounter (Signed)
As long as he is not having significant rectal bleeding I am OK that he be considered non-urgent.  If his hemoglobin is decreasing then I may have some concerns about waiting.  He could come in for a blood count to see where things are, otherwise OK to hold on scheduling. Thanks. GM

## 2018-08-24 NOTE — Telephone Encounter (Signed)
Dr Rush Landmark we are reviewing schedules for possible cancellations for the next 2 weeks.  Can this pt wait?

## 2018-08-25 NOTE — Telephone Encounter (Signed)
Left message on machine to call back  

## 2018-08-25 NOTE — Telephone Encounter (Signed)
The pt has been scheduled for 4/16 with Dr Rush Landmark and will call if he has any significant bleeding.

## 2018-09-23 ENCOUNTER — Other Ambulatory Visit: Payer: Self-pay | Admitting: General Surgery

## 2018-09-23 ENCOUNTER — Encounter: Payer: Self-pay | Admitting: General Surgery

## 2018-09-24 ENCOUNTER — Other Ambulatory Visit: Payer: Self-pay

## 2018-09-24 ENCOUNTER — Ambulatory Visit (INDEPENDENT_AMBULATORY_CARE_PROVIDER_SITE_OTHER): Payer: BLUE CROSS/BLUE SHIELD | Admitting: Gastroenterology

## 2018-09-24 DIAGNOSIS — D126 Benign neoplasm of colon, unspecified: Secondary | ICD-10-CM | POA: Diagnosis not present

## 2018-09-24 DIAGNOSIS — Z86010 Personal history of colonic polyps: Secondary | ICD-10-CM

## 2018-09-24 NOTE — Progress Notes (Signed)
[place] VISIT   Primary Care Provider Janith Lima, MD [address] [address] [phone]  Referring Provider Dr. Tarri Glenn  Patient Profile: Christopher Byrd is a 48 y.o. male with a pmh significant for hyperlipidemia, GERD, arthritis, OSA, PTSD, adenomatous colon polyps.  The patient presents to the [facility] for an evaluation and management of problem(s) noted below:  Problem List 1. Tubulovillous adenoma of colon   2. History of colonic polyps     History of Present Illness Due to the COVID-19 Pandemic, this service was provided via telemedicine using Audio/Visual Media The patient was located at home The provider was located in the office. The patient did consent to this visit and is aware of charges through their insurance. The patient was referred by Dr. Tarri Glenn Other persons participating in this telemedicine service included his wife.  Please see initial consultation note by Dr. Tarri Glenn for full details of HPI.  In brief, the patient was initially seen in December 2019 by my partner Dr. Tarri Glenn for evaluation of painless rectal bleeding.  The patient had no prior history of colon cancer screening or family history of colon cancer or colon polyps.  The patient underwent a colonoscopy in March with findings of diverticulosis in the left colon, a 2 mm polyp in the sigmoid colon that returned as benign tissue, and a greater than 30 mm polyp in the proximal descending colon at 50 cm that was pedunculated with a large, thick stalk.  Biopsies showed this was a tubulovillous adenoma.  The polyp was not attempted for resection due to concern for risk of bleeding.  The patient is referred for discussion of possible advanced polyp resection.  The patient not had any other issues over the course the last few weeks.  His rectal bleeding has not been an issue at current.  He has had no abdominal pain or other GI  symptoms that have caused him concern.  GI Review of Systems Positive as above Negative for dysphagia, odynophagia, nausea, vomiting, melena, change in bowel habits  Review of Systems General: Denies fevers/chills/weight loss HEENT: Denies oral lesions Cardiovascular: Denies chest pain Pulmonary: Denies shortness of breath Gastroenterological: See HPI Genitourinary: Denies darkened urine Hematological: Denies easy bruising/bleeding Dermatological: Denies jaundice Psychological: Mood is anxious to try and get this large polyp taken care of   Medications Current Outpatient Medications  Medication Sig Dispense Refill  . atorvastatin (LIPITOR) 40 MG tablet TAKE 1 TABLET BY MOUTH EVERY DAY 90 tablet 1  . baclofen (LIORESAL) 10 MG tablet baclofen 10 mg tablet    . buPROPion (WELLBUTRIN SR) 150 MG 12 hr tablet Take 150 mg by mouth 2 (two) times daily.  5  . clonazePAM (KLONOPIN) 1 MG tablet clonazepam 1 mg tablet    . fluticasone (FLONASE) 50 MCG/ACT nasal spray Place 1 spray into both nostrils daily.     Marland Kitchen loratadine (CLARITIN) 10 MG tablet Take 10 mg by mouth daily.    . naproxen sodium (ANAPROX) 550 MG tablet naproxen sodium 550 mg tablet    . traZODone (DESYREL) 50 MG tablet Take 200 mg by mouth at bedtime.  1   No current facility-administered medications for this visit.     Allergies Allergies  Allergen Reactions  . Simvastatin     Muscle aches    Histories Past Medical History:  Diagnosis Date  . Arthritis   . GERD (gastroesophageal reflux disease)   . Hyperlipidemia   .  PTSD (post-traumatic stress disorder)   . Sleep apnea    cpap usuage   Past Surgical History:  Procedure Laterality Date  . APPENDECTOMY  2002  . cyst removal from rectum    . HAND SURGERY Left    Social History   Socioeconomic History  . Marital status: Married    Spouse name: Not on file  . Number of children: 2  . Years of education: Not on file  . Highest education level: Not on file   Occupational History  . Not on file  Social Needs  . Financial resource strain: Not on file  . Food insecurity:    Worry: Not on file    Inability: Not on file  . Transportation needs:    Medical: Not on file    Non-medical: Not on file  Tobacco Use  . Smoking status: Never Smoker  . Smokeless tobacco: Never Used  Substance and Sexual Activity  . Alcohol use: Yes    Alcohol/week: 5.0 standard drinks    Types: 5 Cans of beer per week    Comment: daily  . Drug use: No  . Sexual activity: Yes    Partners: Female  Lifestyle  . Physical activity:    Days per week: Not on file    Minutes per session: Not on file  . Stress: Not on file  Relationships  . Social connections:    Talks on phone: Not on file    Gets together: Not on file    Attends religious service: Not on file    Active member of club or organization: Not on file    Attends meetings of clubs or organizations: Not on file    Relationship status: Not on file  . Intimate partner violence:    Fear of current or ex partner: Not on file    Emotionally abused: Not on file    Physically abused: Not on file    Forced sexual activity: Not on file  Other Topics Concern  . Not on file  Social History Narrative   Caffienated drinks-no   Seat belt use often-yes   Regular Exercise-yes   Smoke alarm in the home-yes   Firearms/guns in the home-yes   History of physical abuse-no               Family History  Problem Relation Age of Onset  . Breast cancer Mother   . Hypertension Father   . Diabetes Father   . Prostate cancer Father   . Crohn's disease Father   . Sickle cell anemia Brother   . Diabetes Other   . Hypertension Other   . Cancer Other        Prostate and Breast Cancer  . Alcohol abuse Neg Hx   . Drug abuse Neg Hx   . Heart disease Neg Hx   . Hyperlipidemia Neg Hx   . Stroke Neg Hx   . Colon cancer Neg Hx   . Colon polyps Neg Hx   . Rectal cancer Neg Hx   . Stomach cancer Neg Hx   .  Inflammatory bowel disease Neg Hx   . Liver disease Neg Hx   . Pancreatic cancer Neg Hx    I have reviewed his medical, social, and family history in detail and updated the electronic medical record as necessary.    PHYSICAL EXAMINATION  Telehealth visit   REVIEW OF DATA  I reviewed the following data at the time of this encounter:  GI Procedures  and Studies  March 2020 colonoscopy - Non-bleeding internal hemorrhoids. - One 30 mm polyp in the proximal descending colon. Biopsied. Tattooed. - One 2 mm polyp in the sigmoid colon, removed with a cold snare. Resected and retrieved. - Diverticulosis in the sigmoid colon and in the descending colon. - The examination was otherwise normal on direct and retroflexion views.  Laboratory Studies  Reviewed in epic  Imaging Studies  No relevant studies to review   ASSESSMENT  Mr. Fohl is a 48 y.o. male with a pmh significant for hyperlipidemia, GERD, arthritis, OSA, PTSD, adenomatous colon polyps.  The patient is seen today for evaluation and management of:  1. Tubulovillous adenoma of colon   2. History of colonic polyps    The patient seems to be hemodynamically and clinically stable at this point in time.  Based upon the description and endoscopic pictures I do feel that it is reasonable to pursue an Advanced Polypectomy attempt of the polyp/lesion.  This tubulovillous adenoma has a very thick stalk per report and likely has a large vessel within it.  We discussed some of the techniques of advanced polypectomy which include Endoscopic Mucosal Resection, OVESCO Full-Thickness Resection, Endorotor Morcellation, and Tissue Ablation via Fulguration.  I think the patient will likely need a poly-loop or significant amount of fluid in attempt of mucosal resection.  The risks and benefits of endoscopic evaluation were discussed with the patient; these include but are not limited to the risk of perforation, infection, bleeding, missed lesions, lack  of diagnosis, severe illness requiring hospitalization, as well as anesthesia and sedation related illnesses.  During attempts at advanced polypectomy, the risks of bleeding and perforation/leak are increased as opposed to diagnostic and screening colonoscopies, and that was discussed with the patient as well.   In addition, I explained that with the possible need for piecemeal resection, subsequent short-interval endoscopic evaluation for follow up and potential retreatment of the lesion/area may be necessary.  I did offer, a referral to surgery in order for patient to have opportunity to discuss surgical management/intervention prior to finalizing decision for attempt at endoscopic removal, however, the patient deferred on this.  If, after attempt at removal of the polyp, it is found that the patient has a complication or that an invasive lesion or malignant lesion is found, or that the polyp continues to recur, the patient is aware and understands that surgery may still be indicated/required.  Due to the COVID-19 pandemic and this polyp not showing evidence of high-grade dysplasia but being a high risk lesion I would like to try and pursue a colonoscopy with advanced polyp resection in the next 4-6 weeks.  If the patient's laboratories suggest need for earlier procedure or he has recurrent evidence of significant hematochezia although I do not think this is the etiology of it may require [place] to pursue an earlier procedure.  All of the patient questions were answered, to the best of my ability, and the patient agrees to the aforementioned plan of action with follow-up as indicated.   PLAN  Laboratories as outlined below 3 to 4 weeks before procedure Colonoscopy with advanced polyp resection in 4 to 6 weeks   Orders Placed This Encounter  Procedures  . CBC  . Basic Metabolic Panel (BMET)  . INR/PT    New Prescriptions   No medications on file   Modified Medications   No medications on file     Planned Follow Up No follow-ups on file.   Justice Britain, MD  [facility] Advanced Endoscopy Office # [phone]

## 2018-09-24 NOTE — Patient Instructions (Addendum)
If you are age 50 or older, your body mass index should be between 23-30. Your There is no height or weight on file to calculate BMI. If this is out of the aforementioned range listed, please consider follow up with your Primary Care Provider.  If you are age 70 or younger, your body mass index should be between 19-25. Your There is no height or weight on file to calculate BMI. If this is out of the aformentioned range listed, please consider follow up with your Primary Care Provider.   It has been recommended to you by your physician that you have a(n) Colon+EMR(90 mins) completed. We did not schedule the procedure(s) today due to Covid-19 Please contact our office at [phone] should you decide to have the procedure completed.   Your provider has requested that you go to the basement level for lab work before leaving today. Press "B" on the elevator. The lab is located at the first door on the left as you exit the elevator.  Thank you for choosing me and [facility].  Dr. Rush Landmark

## 2018-09-28 ENCOUNTER — Encounter: Payer: Self-pay | Admitting: Gastroenterology

## 2018-09-28 DIAGNOSIS — D126 Benign neoplasm of colon, unspecified: Secondary | ICD-10-CM | POA: Insufficient documentation

## 2018-09-28 DIAGNOSIS — Z860100 Personal history of colon polyps, unspecified: Secondary | ICD-10-CM | POA: Insufficient documentation

## 2018-10-19 ENCOUNTER — Other Ambulatory Visit (INDEPENDENT_AMBULATORY_CARE_PROVIDER_SITE_OTHER): Payer: BLUE CROSS/BLUE SHIELD

## 2018-10-19 DIAGNOSIS — D126 Benign neoplasm of colon, unspecified: Secondary | ICD-10-CM | POA: Diagnosis not present

## 2018-10-19 DIAGNOSIS — Z86010 Personal history of colonic polyps: Secondary | ICD-10-CM | POA: Diagnosis not present

## 2018-10-19 LAB — CBC
HCT: 44.3 % (ref 39.0–52.0)
Hemoglobin: 15 g/dL (ref 13.0–17.0)
MCHC: 34 g/dL (ref 30.0–36.0)
MCV: 89.6 fl (ref 78.0–100.0)
Platelets: 189 10*3/uL (ref 150.0–400.0)
RBC: 4.94 Mil/uL (ref 4.22–5.81)
RDW: 13.4 % (ref 11.5–15.5)
WBC: 7.2 10*3/uL (ref 4.0–10.5)

## 2018-10-19 LAB — PROTIME-INR
INR: 1.1 ratio — ABNORMAL HIGH (ref 0.8–1.0)
Prothrombin Time: 13.1 s (ref 9.6–13.1)

## 2018-10-20 LAB — BASIC METABOLIC PANEL
BUN: 21 mg/dL (ref 6–23)
CO2: 24 mEq/L (ref 19–32)
Calcium: 8.6 mg/dL (ref 8.4–10.5)
Chloride: 105 mEq/L (ref 96–112)
Creatinine, Ser: 1.21 mg/dL (ref 0.40–1.50)
GFR: 76.91 mL/min (ref >60.00)
Glucose, Bld: 88 mg/dL (ref 70–99)
Potassium: 4.1 mEq/L (ref 3.5–5.1)
Sodium: 139 mEq/L (ref 135–145)

## 2018-11-09 ENCOUNTER — Other Ambulatory Visit: Payer: Self-pay

## 2018-11-09 DIAGNOSIS — Z860100 Personal history of colon polyps, unspecified: Secondary | ICD-10-CM

## 2018-11-09 DIAGNOSIS — D126 Benign neoplasm of colon, unspecified: Secondary | ICD-10-CM

## 2018-11-09 MED ORDER — PEG 3350-KCL-NA BICARB-NACL 420 G PO SOLR: 4000.0000 mL | Freq: Once | ORAL | 0 refills | Status: AC

## 2018-11-12 ENCOUNTER — Other Ambulatory Visit: Payer: Self-pay

## 2018-11-12 ENCOUNTER — Telehealth: Payer: Self-pay

## 2018-11-12 NOTE — Telephone Encounter (Signed)
Called and left message for pt. Procedure from 11/25/2018 has been moved to 11/23/2018 [facility] @ 11:15am due to timing at hospital.

## 2018-11-20 ENCOUNTER — Other Ambulatory Visit: Payer: Self-pay

## 2018-11-20 ENCOUNTER — Other Ambulatory Visit (HOSPITAL_COMMUNITY): Payer: BLUE CROSS/BLUE SHIELD

## 2018-11-20 ENCOUNTER — Other Ambulatory Visit (HOSPITAL_COMMUNITY)
Admission: RE | Admit: 2018-11-20 | Discharge: 2018-11-20 | Disposition: A | Discharge status: Home / Self Care | Payer: BLUE CROSS/BLUE SHIELD | Source: Ambulatory Visit | Attending: Gastroenterology | Admitting: Gastroenterology

## 2018-11-20 ENCOUNTER — Encounter (HOSPITAL_COMMUNITY): Payer: Self-pay | Admitting: *Deleted

## 2018-11-20 DIAGNOSIS — Z1159 Encounter for screening for other viral diseases: Secondary | ICD-10-CM | POA: Diagnosis not present

## 2018-11-20 DIAGNOSIS — Z01812 Encounter for preprocedural laboratory examination: Secondary | ICD-10-CM | POA: Insufficient documentation

## 2018-11-21 LAB — NOVEL CORONAVIRUS, NAA (HOSP ORDER, SEND-OUT TO REF LAB; TAT 18-24 HRS): SARS-CoV-2, NAA: NOT DETECTED

## 2018-11-23 ENCOUNTER — Encounter (HOSPITAL_COMMUNITY): Payer: Self-pay

## 2018-11-23 ENCOUNTER — Ambulatory Visit (HOSPITAL_COMMUNITY): Payer: BLUE CROSS/BLUE SHIELD

## 2018-11-23 ENCOUNTER — Ambulatory Visit (HOSPITAL_COMMUNITY)
Admission: RE | Admit: 2018-11-23 | Discharge: 2018-11-23 | Disposition: A | Discharge status: Home / Self Care | Payer: BLUE CROSS/BLUE SHIELD | Attending: Gastroenterology | Admitting: Gastroenterology

## 2018-11-23 ENCOUNTER — Other Ambulatory Visit: Payer: Self-pay

## 2018-11-23 ENCOUNTER — Encounter (HOSPITAL_COMMUNITY)
Admission: RE | Disposition: A | Discharge status: Home / Self Care | Payer: Self-pay | Source: Home / Self Care | Attending: Gastroenterology

## 2018-11-23 ENCOUNTER — Telehealth: Payer: Self-pay

## 2018-11-23 ENCOUNTER — Ambulatory Visit (HOSPITAL_COMMUNITY): Payer: BLUE CROSS/BLUE SHIELD | Admitting: Anesthesiology

## 2018-11-23 DIAGNOSIS — Z86010 Personal history of colonic polyps: Secondary | ICD-10-CM

## 2018-11-23 DIAGNOSIS — K219 Gastro-esophageal reflux disease without esophagitis: Secondary | ICD-10-CM | POA: Diagnosis not present

## 2018-11-23 DIAGNOSIS — K635 Polyp of colon: Secondary | ICD-10-CM | POA: Diagnosis not present

## 2018-11-23 DIAGNOSIS — K5730 Diverticulosis of large intestine without perforation or abscess without bleeding: Secondary | ICD-10-CM | POA: Insufficient documentation

## 2018-11-23 DIAGNOSIS — Z79899 Other long term (current) drug therapy: Secondary | ICD-10-CM | POA: Diagnosis not present

## 2018-11-23 DIAGNOSIS — F4310 Post-traumatic stress disorder, unspecified: Secondary | ICD-10-CM | POA: Diagnosis not present

## 2018-11-23 DIAGNOSIS — D126 Benign neoplasm of colon, unspecified: Secondary | ICD-10-CM

## 2018-11-23 DIAGNOSIS — G4730 Sleep apnea, unspecified: Secondary | ICD-10-CM | POA: Diagnosis not present

## 2018-11-23 DIAGNOSIS — D124 Benign neoplasm of descending colon: Secondary | ICD-10-CM | POA: Diagnosis not present

## 2018-11-23 DIAGNOSIS — E785 Hyperlipidemia, unspecified: Secondary | ICD-10-CM | POA: Diagnosis not present

## 2018-11-23 DIAGNOSIS — K641 Second degree hemorrhoids: Secondary | ICD-10-CM | POA: Diagnosis not present

## 2018-11-23 DIAGNOSIS — Z888 Allergy status to other drugs, medicaments and biological substances status: Secondary | ICD-10-CM | POA: Insufficient documentation

## 2018-11-23 DIAGNOSIS — K631 Perforation of intestine (nontraumatic): Secondary | ICD-10-CM

## 2018-11-23 HISTORY — PX: COLONOSCOPY WITH PROPOFOL: SHX5780

## 2018-11-23 HISTORY — PX: HEMOSTASIS CLIP PLACEMENT: SHX6857

## 2018-11-23 HISTORY — PX: HEMOSTASIS CONTROL: SHX6838

## 2018-11-23 HISTORY — PX: ENDOSCOPIC MUCOSAL RESECTION: SHX6839

## 2018-11-23 HISTORY — PX: HOT HEMOSTASIS: SHX5433

## 2018-11-23 SURGERY — COLONOSCOPY WITH PROPOFOL
Anesthesia: Monitor Anesthesia Care

## 2018-11-23 MED ORDER — LIDOCAINE HCL (CARDIAC) PF 100 MG/5ML IV SOSY
PREFILLED_SYRINGE | INTRAVENOUS | Status: DC | PRN
  Administered 2018-11-23: 40 mg via INTRAVENOUS

## 2018-11-23 MED ORDER — MIDAZOLAM HCL 5 MG/5ML IJ SOLN
INTRAMUSCULAR | Status: DC | PRN
  Administered 2018-11-23 (×2): 1 mg via INTRAVENOUS

## 2018-11-23 MED ORDER — EPHEDRINE SULFATE 50 MG/ML IJ SOLN
INTRAMUSCULAR | Status: DC | PRN
  Administered 2018-11-23: 5 mg via INTRAVENOUS

## 2018-11-23 MED ORDER — FENTANYL CITRATE (PF) 100 MCG/2ML IJ SOLN
50.0000 ug | Freq: Once | INTRAMUSCULAR | Status: AC
  Administered 2018-11-23: 50 ug via INTRAVENOUS

## 2018-11-23 MED ORDER — ONDANSETRON HCL 4 MG/2ML IJ SOLN
INTRAMUSCULAR | Status: DC | PRN
  Administered 2018-11-23: 4 mg via INTRAVENOUS

## 2018-11-23 MED ORDER — SODIUM CHLORIDE (PF) 0.9 % IJ SOLN
PREFILLED_SYRINGE | INTRAMUSCULAR | Status: DC | PRN
  Administered 2018-11-23: 13:00:00 10.5 mL

## 2018-11-23 MED ORDER — EPINEPHRINE 1 MG/10ML IJ SOSY
PREFILLED_SYRINGE | INTRAMUSCULAR | Status: AC
  Filled 2018-11-23: qty 20

## 2018-11-23 MED ORDER — FENTANYL CITRATE (PF) 100 MCG/2ML IJ SOLN
25.0000 ug | Freq: Once | INTRAMUSCULAR | Status: AC
  Administered 2018-11-23: 25 ug via INTRAVENOUS

## 2018-11-23 MED ORDER — FENTANYL CITRATE (PF) 100 MCG/2ML IJ SOLN
INTRAMUSCULAR | Status: AC
  Filled 2018-11-23: qty 2

## 2018-11-23 MED ORDER — HYDROMORPHONE HCL 1 MG/ML IJ SOLN
INTRAMUSCULAR | Status: AC
  Filled 2018-11-23: qty 1

## 2018-11-23 MED ORDER — SUCCINYLCHOLINE CHLORIDE 20 MG/ML IJ SOLN
INTRAMUSCULAR | Status: DC | PRN
  Administered 2018-11-23: 200 mg via INTRAVENOUS

## 2018-11-23 MED ORDER — LACTATED RINGERS IV SOLN
INTRAVENOUS | Status: DC
  Administered 2018-11-23: 10:00:00 via INTRAVENOUS

## 2018-11-23 MED ORDER — PROPOFOL 10 MG/ML IV BOLUS
INTRAVENOUS | Status: DC | PRN
  Administered 2018-11-23 (×2): 50 mg via INTRAVENOUS

## 2018-11-23 MED ORDER — SODIUM CHLORIDE 0.9 % IV SOLN: INTRAVENOUS | Status: DC

## 2018-11-23 MED ORDER — PHENYLEPHRINE HCL (PRESSORS) 10 MG/ML IV SOLN
INTRAVENOUS | Status: DC | PRN
  Administered 2018-11-23: 80 ug via INTRAVENOUS
  Administered 2018-11-23: 120 ug via INTRAVENOUS
  Administered 2018-11-23: 80 ug via INTRAVENOUS

## 2018-11-23 MED ORDER — FENTANYL CITRATE (PF) 100 MCG/2ML IJ SOLN
INTRAMUSCULAR | Status: DC | PRN
  Administered 2018-11-23: 50 ug via INTRAVENOUS

## 2018-11-23 MED ORDER — HYDROMORPHONE HCL 1 MG/ML IJ SOLN
0.5000 mg | Freq: Once | INTRAMUSCULAR | Status: AC
  Administered 2018-11-23: 16:00:00 0.5 mg via INTRAVENOUS

## 2018-11-23 MED ORDER — PROPOFOL 500 MG/50ML IV EMUL
INTRAVENOUS | Status: DC | PRN
  Administered 2018-11-23: 200 ug/kg/min via INTRAVENOUS

## 2018-11-23 SURGICAL SUPPLY — 22 items

## 2018-11-23 NOTE — H&P (Signed)
GASTROENTEROLOGY OUTPATIENT PROCEDURE H&P NOTE   Primary Care Physician: Janith Lima, MD  HPI: Christopher Byrd is a 48 y.o. male who presents for Colonoscopy with attempt at Advanced Polypectomy.  Past Medical History:  Diagnosis Date  . Arthritis    knee back  . GERD (gastroesophageal reflux disease)   . Hyperlipidemia   . PTSD (post-traumatic stress disorder)   . PTSD (post-traumatic stress disorder)   . Sleep apnea    cpap usuage   Past Surgical History:  Procedure Laterality Date  . APPENDECTOMY  2002  . COLONOSCOPY    . cyst removal from rectum    . HAND SURGERY Left    fracture   Current Facility-Administered Medications  Medication Dose Route Frequency Provider Last Rate Last Dose  . lactated ringers infusion   Intravenous Continuous Mansouraty, Telford Nab., MD 20 mL/hr at 11/23/18 0946     Allergies  Allergen Reactions  . Simvastatin     Muscle aches   Family History  Problem Relation Age of Onset  . Breast cancer Mother   . Hypertension Father   . Diabetes Father   . Prostate cancer Father   . Crohn's disease Father   . Sickle cell anemia Brother   . Diabetes Other   . Hypertension Other   . Cancer Other        Prostate and Breast Cancer  . Alcohol abuse Neg Hx   . Drug abuse Neg Hx   . Heart disease Neg Hx   . Hyperlipidemia Neg Hx   . Stroke Neg Hx   . Colon cancer Neg Hx   . Colon polyps Neg Hx   . Rectal cancer Neg Hx   . Stomach cancer Neg Hx   . Inflammatory bowel disease Neg Hx   . Liver disease Neg Hx   . Pancreatic cancer Neg Hx    Social History   Socioeconomic History  . Marital status: Married    Spouse name: Not on file  . Number of children: 2  . Years of education: Not on file  . Highest education level: Not on file  Occupational History  . Not on file  Social Needs  . Financial resource strain: Not on file  . Food insecurity    Worry: Not on file    Inability: Not on file  . Transportation needs   Medical: Not on file    Non-medical: Not on file  Tobacco Use  . Smoking status: Never Smoker  . Smokeless tobacco: Never Used  Substance and Sexual Activity  . Alcohol use: Yes    Alcohol/week: 7.0 standard drinks    Types: 7 Glasses of wine per week  . Drug use: No  . Sexual activity: Yes    Partners: Female  Lifestyle  . Physical activity    Days per week: Not on file    Minutes per session: Not on file  . Stress: Not on file  Relationships  . Social Herbalist on phone: Not on file    Gets together: Not on file    Attends religious service: Not on file    Active member of club or organization: Not on file    Attends meetings of clubs or organizations: Not on file    Relationship status: Not on file  . Intimate partner violence    Fear of current or ex partner: Not on file    Emotionally abused: Not on file    Physically abused:  Not on file    Forced sexual activity: Not on file  Other Topics Concern  . Not on file  Social History Narrative   Caffienated drinks-no   Seat belt use often-yes   Regular Exercise-yes   Smoke alarm in the home-yes   Firearms/guns in the home-yes   History of physical abuse-no                Physical Exam: Vital signs in last 24 hours: Temp:  [98.2 F (36.8 C)] 98.2 F (36.8 C) (06/15 0938) Pulse Rate:  [54] 54 (06/15 0938) Resp:  [13] 13 (06/15 0938) BP: (161)/(74) 161/74 (06/15 0938) SpO2:  [100 %] 100 % (06/15 0938) Weight:  [104.3 kg] 104.3 kg (06/15 0938)   GEN: NAD EYE: Sclerae anicteric ENT: MMM CV: RR without R/Gs  RESP: CTAB posteriorly GI: Soft, NT/ND NEURO:  Alert & Oriented x 3  Lab Results: No results for input(s): WBC, HGB, HCT, PLT in the last 72 hours. BMET No results for input(s): NA, K, CL, CO2, GLUCOSE, BUN, CREATININE, CALCIUM in the last 72 hours. LFT No results for input(s): PROT, ALBUMIN, AST, ALT, ALKPHOS, BILITOT, BILIDIR, IBILI in the last 72 hours. PT/INR No results for input(s):  LABPROT, INR in the last 72 hours.   Impression / Plan: This is a 48 y.o.male who presents for Colonoscopy with attempt at Advanced Polypectomy.  Based upon the description and endoscopic pictures I do feel that it is reasonable to pursue an Advanced Polypectomy attempt of the polyp/lesion.  We discussed some of the techniques of advanced polypectomy which include Endoscopic Mucosal Resection, OVESCO Full-Thickness Resection, Endorotor Morcellation, and Tissue Ablation via Fulguration.  The risks and benefits of endoscopic evaluation were discussed with the patient; these include but are not limited to the risk of perforation, infection, bleeding, missed lesions, lack of diagnosis, severe illness requiring hospitalization, as well as anesthesia and sedation related illnesses.  During attempts at advanced polypectomy, the risks of bleeding and perforation/leak are increased as opposed to diagnostic and screening colonoscopies, and that was discussed with the patient as well.   In addition, I explained that with the possible need for piecemeal resection, subsequent short-interval endoscopic evaluation for follow up and potential retreatment of the lesion/area may be necessary.  If, after attempt at removal of the polyp, it is found that the patient has a complication or that an invasive lesion or malignant lesion is found, or that the polyp continues to recur, the patient is aware and understands that surgery may still be indicated/required.  All patient questions were answered, to the best of my ability, and the patient agrees to the aforementioned plan of action with follow-up as indicated.   The risks and benefits of endoscopic evaluation were discussed with the patient; these include but are not limited to the risk of perforation, infection, bleeding, missed lesions, lack of diagnosis, severe illness requiring hospitalization, as well as anesthesia and sedation related illnesses.  The patient is agreeable  to proceed.    Justice Britain, MD [facility] Advanced Endoscopy Office # [phone]

## 2018-11-23 NOTE — Progress Notes (Signed)
Patient reports LLQ pain 10/10. Abdomen soft. Patient resting in stretcher at this time. Verbal order for Fentanyl 36mcg IV x1. Will continue to monitor.

## 2018-11-23 NOTE — Progress Notes (Signed)
Patient has been evaluated on multiple times after the completion of his procedure. We gave him a total of 100 fentanyl and proceeded with obtaining a KUB as well as chest x-ray. I have reviewed this with radiology and there is no evidence of any perforation acutely. Patient as per RN Bailey's note has had improvement in discomfort and has been able to walk throughout the unit. He has been able to urinate as well. Stents of discussion with the patient after the procedure and after having received all this we will give him a dose of 0.5 of Dilaudid. Anticipate as does he that he wants to go home if this is the case he will be able to proceed home with close monitoring by his wife. I feel comfortable with the patient likely going to be able to pass more gas. We will allow the patient to obtain Tylenol and use as necessary over the course of the evening if that is needed. The patient is aware that if he has significant worsening of pain at home or any evidence of bleeding then he will require a discussion with the on-call physician whom I will update. Likely would need to come into the ED for further evaluation thereafter. I have called and spoken with the patient's wife and she understands and is appreciative for all the care as well as the calls that we have given. If all things go well we should have him discharged in the coming hour.  Justice Britain, MD [facility] Advanced Endoscopy Office # [phone]

## 2018-11-23 NOTE — Progress Notes (Signed)
Report given to Jamie Bailey, RN.

## 2018-11-23 NOTE — Progress Notes (Signed)
Mansouraty MD saw patient and X ray while at bedside. Aware that patient continues to complain of 5/10 abdominal pain. Mansouraty MD advises to ambulate patient in hall and give 0.5 Dilaudid if patient still continues to have pain.

## 2018-11-23 NOTE — Op Note (Addendum)
[facility] Patient Name: Christopher Byrd Procedure Date : 11/23/2018 MRN: [id number] Attending MD: Justice Britain , MD Date of Birth: 18-Aug-1970 CSN: [id number] Age: 48 Admit Type: Outpatient Procedure:                Colonoscopy Indications:              Excision of colonic polyp Providers:                Justice Britain, MD, Carlyn Reichert, RN, Elspeth Cho Tech., Technician, Tawni Carnes, CRNA Referring MD:             Thornton Park MD, MD Medicines:                Monitored Anesthesia Care, General Anesthesia Complications:            No immediate complications. Estimated Blood Loss:     Estimated blood loss was minimal. Procedure:                Pre-Anesthesia Assessment:                           - Prior to the procedure, a History and Physical                            was performed, and patient medications and                            allergies were reviewed. The patient's tolerance of                            previous anesthesia was also reviewed. The risks                            and benefits of the procedure and the sedation                            options and risks were discussed with the patient.                            All questions were answered, and informed consent                            was obtained. Prior Anticoagulants: The patient has                            taken no previous anticoagulant or antiplatelet                            agents. ASA Grade Assessment: II - A patient with                            mild systemic disease. After reviewing the risks  and benefits, the patient was deemed in                            satisfactory condition to undergo the procedure.                           After obtaining informed consent, the colonoscope                            was passed under direct vision. Throughout the                            procedure, the  patient's blood pressure, pulse, and                            oxygen saturations were monitored continuously. The                            CF-HQ190L ([id number]) Olympus colonoscope was                            introduced through the anus and advanced to the 5                            cm into the ileum. The colonoscopy was technically                            difficult and complex due to the patient's position                            intolerance - required intubation due to movements                            and retching and the Mucosectomy itself due to                            large polyp size. Successful completion of the                            procedure was aided by performing the maneuvers                            documented (below) in this report. The patient                            tolerated the procedure. The quality of the bowel                            preparation was excellent. The terminal ileum,                            ileocecal valve, appendiceal orifice, and rectum  were photographed. Scope In: 11:23:33 AM Scope Out: 1:33:27 PM Scope Withdrawal Time: 2 hours 5 minutes 29 seconds  Total Procedure Duration: 2 hours 9 minutes 54 seconds  Findings:      Hemorrhoids were found on perianal exam.      The terminal ileum and ileocecal valve appeared normal.      A greater than 50 mm polyp was found in the proximal descending colon.       The polyp was pedunculated. 2 Attempts at placement of endoloops were       not successful due to the device not cinching appropriately. They were       removed. On the third attempt, the endoloop was maneuvered over the       polyp stalk and closed at the mucosal attachment in order to decrease       risk of and control potential bleeding. The endoloop remained on the       polyp for greater than 10 minutes (since patient needed to then be       intubated during procedure. Preparations were  made for mucosal       resection. A 1:10,000 solution of epinephrine was injected into the       stalk in order to further decrease the risk of bleeding. Snare mucosal       resection was performed. Resection and retrieval were complete. There       was oozing noted after the resection. Epinephrine was injected which       slowed the oozing but did not stop it completely. Coagulation for       hemostasis using bipolar probe was successful. No further bleeding was       noted. To prevent further bleeding after mucosal resection, seven       hemostatic clips were successfully placed (MR conditional). There was no       bleeding at the end of the procedure. I monitored the area for 10       minutes after completion of the last clip being placed without further       evidence of bleeding.      A few small-mouthed diverticula were found in the recto-sigmoid colon       and sigmoid colon.      Non-bleeding non-thrombosed internal hemorrhoids were found during       retroflexion, during perianal exam and during digital exam. The       hemorrhoids were Grade II (internal hemorrhoids that prolapse but reduce       spontaneously). Impression:               - Hemorrhoids found on perianal exam.                           - The examined portion of the ileum was normal.                           - One greater than 50 mm polyp in the proximal                            descending colon, Endoloop placed. Mucosal                            resection performed. Oozing noted, treated with EPI  and bipolar cautery with cessation of bleeding.                            Clips (MR conditional) were placed to further                            decrease risk of bleeding.                           - Diverticulosis in the recto-sigmoid colon and in                            the sigmoid colon.                           - Non-bleeding non-thrombosed internal hemorrhoids. Recommendation:            - The patient will be observed post-procedure,                            until all discharge criteria are met.                           - Discharge patient to home.                           - Patient has a contact number available for                            emergencies. The signs and symptoms of potential                            delayed complications were discussed with the                            patient. Return to normal activities tomorrow.                            Written discharge instructions were provided to the                            patient.                           - Would begin a lighter diet today into tomorrow.                            Advance diet as tolerated in the AM.                           - Await pathology results.                           - Expect to have small amount of bleeding noted on  first bowel movements but expect that it should not                            be for greater than 24 hours. Anything more than                            that, and patient and wife aware of need to come                            back into the hospital or if evidence of presyncope                            or syncope.                           - Repeat colonoscopy in 6-12 months for                            surveillance based on pathology results.                           - Minimize NSAIDs for next 2-weeks as able.                           - The findings and recommendations were discussed                            with the patient.                           - The findings and recommendations were discussed                            with the patient's family. Procedure Code(s):        --- Professional ---                           [phone], Colonoscopy, flexible; with endoscopic                            mucosal resection Diagnosis Code(s):        --- Professional ---                           K64.1, Second degree  hemorrhoids                           K63.5, Polyp of colon                           K57.30, Diverticulosis of large intestine without                            perforation or abscess without bleeding CPT copyright 2019 [employer]. All rights reserved. The codes documented in this report are preliminary and  upon coder review may  be revised to meet current compliance requirements. Justice Britain, MD 11/23/2018 2:26:36 PM Number of Addenda: 0

## 2018-11-23 NOTE — Progress Notes (Signed)
LLQ pain 9/10, verbal order from Dr. Rush Landmark for 29mcg Fentanyl IV x1. Patient placed on 3LNC.

## 2018-11-23 NOTE — Progress Notes (Signed)
Pt. A&OX4 stating that his pain is improved and feels that he is able to go home at this time. Wife notified via phone, and advised to not drive today r/t medications.

## 2018-11-23 NOTE — Progress Notes (Signed)
Pt ambulated around endo department 2 times and to restroom. Pt continues to complain of 5/10 abdominal pain. 0.5 Dilaudid given as advised by Mansouraty MD. MD at bedside and aware of patient ambulation and complaint. Will continue to monitor.

## 2018-11-23 NOTE — Telephone Encounter (Signed)
-----   Message from Irving Copas., MD sent at 11/23/2018  4:13 PM EDT ----- Joelene Millin, Quite a colonoscopy and polyp. He has had some discomfort post-procedure but improving, I suspect it is from Epinephrine related. KUB/CXR no perforation. I'll see what the final pathology is and plan follow up thereafter. Yer Castello, can you check in on the patient tomorrow and see how he is doing.  I'll be away so the Doc of Day can be available or myself if an emergency. Thanks. GM

## 2018-11-23 NOTE — Anesthesia Preprocedure Evaluation (Signed)
Anesthesia Evaluation  Patient identified by MRN, date of birth, ID band Patient awake    Reviewed: Allergy & Precautions, NPO status , Patient's Chart, lab work & pertinent test results  History of Anesthesia Complications Negative for: history of anesthetic complications  Airway Mallampati: I  TM Distance: >3 FB Neck ROM: Full    Dental  (+) Dental Advisory Given   Pulmonary sleep apnea and Continuous Positive Airway Pressure Ventilation ,  11/20/2018 novel coronavirus NEG   breath sounds clear to auscultation       Cardiovascular negative cardio ROS   Rhythm:Regular Rate:Normal     Neuro/Psych PSYCHIATRIC DISORDERS (PTSD) Anxiety negative neurological ROS     GI/Hepatic Neg liver ROS, GERD  Controlled and Medicated,  Endo/Other  negative endocrine ROS  Renal/GU negative Renal ROS     Musculoskeletal   Abdominal (+) + obese,   Peds  Hematology negative hematology ROS (+)   Anesthesia Other Findings   Reproductive/Obstetrics                             Anesthesia Physical Anesthesia Plan  ASA: III  Anesthesia Plan: MAC   Post-op Pain Management:    Induction:   PONV Risk Score and Plan: 1 and Treatment may vary due to age or medical condition and Ondansetron  Airway Management Planned: Natural Airway and Simple Face Mask  Additional Equipment:   Intra-op Plan:   Post-operative Plan:   Informed Consent: I have reviewed the patients History and Physical, chart, labs and discussed the procedure including the risks, benefits and alternatives for the proposed anesthesia with the patient or authorized representative who has indicated his/her understanding and acceptance.     Dental advisory given  Plan Discussed with: CRNA and Surgeon  Anesthesia Plan Comments:         Anesthesia Quick Evaluation

## 2018-11-23 NOTE — Progress Notes (Signed)
Patient rates LLQ pain 5/10 at this time. Dr. Rush Landmark notified. Verbal order for Fentanyl 79mcg IV x1. He will place orders for abdominal xrays.

## 2018-11-23 NOTE — Telephone Encounter (Signed)
Thornton Park, MD  Mansouraty, Telford Nab., MD; Timothy Lasso, RN        Thanks, Chester Holstein. I knew that was going to be tricky. I am grateful for your help.   Christopher Byrd, I am seeing office patients all day tomorrow and happy to help if Christopher Byrd is having any trouble.

## 2018-11-23 NOTE — Anesthesia Procedure Notes (Signed)
Procedure Name: Intubation Date/Time: 11/23/2018 12:38 PM Performed by: Shirlyn Goltz, CRNA Induction Type: IV induction Laryngoscope Size: Glidescope and 3 Grade View: Grade II Tube type: Oral Tube size: 7.5 mm Number of attempts: 1 Airway Equipment and Method: Video-laryngoscopy and Rigid stylet Placement Confirmation: ETT inserted through vocal cords under direct vision,  positive ETCO2 and breath sounds checked- equal and bilateral Secured at: 24 cm Tube secured with: Tape Dental Injury: Teeth and Oropharynx as per pre-operative assessment

## 2018-11-23 NOTE — Transfer of Care (Addendum)
Immediate Anesthesia Transfer of Care Note  Patient: Christopher Byrd  Procedure(s) Performed: COLONOSCOPY WITH PROPOFOL (N/A ) ENDOSCOPIC MUCOSAL RESECTION (N/A ) HEMOSTASIS CONTROL HEMOSTASIS CLIP PLACEMENT HOT HEMOSTASIS (ARGON PLASMA COAGULATION/BICAP) (N/A )  Patient Location: Endoscopy Unit  Anesthesia Type:General  Level of Consciousness: drowsy and patient cooperative  Airway & Oxygen Therapy: Patient Spontanous Breathing and Patient connected to face mask oxygen  Post-op Assessment: Report given to RN and Post -op Vital signs reviewed and stable  Post vital signs: Reviewed and stable  Last Vitals:  Vitals Value Taken Time  BP 160/73 11/23/18 1422  Temp 36.7 C 11/23/18 1357  Pulse 53 11/23/18 1430  Resp 13 11/23/18 1430  SpO2 100 % 11/23/18 1430  Vitals shown include unvalidated device data.  Last Pain:  Vitals:   11/23/18 1357  TempSrc: Oral  PainSc: 0-No pain         Complications: No apparent anesthesia complications

## 2018-11-23 NOTE — Progress Notes (Signed)
Pt dressed self and ambulated to wheel chair, taken out to car of wife who will be driving him home.

## 2018-11-23 NOTE — Discharge Instructions (Signed)
+ °.  0YOU HAD AN ENDOSCOPIC PROCEDURE TODAY: Refer to the procedure report and other information in the discharge instructions given to you for any specific questions about what was found during the examination. If this information does not answer your questions, please call [place] office at [phone] to clarify.   YOU SHOULD EXPECT: Some feelings of bloating in the abdomen. Passage of more gas than usual. Walking can help get rid of the air that was put into your GI tract during the procedure and reduce the bloating. If you had a lower endoscopy (such as a colonoscopy or flexible sigmoidoscopy) you may notice spotting of blood in your stool or on the toilet paper. Some abdominal soreness may be present for a day or two, also.  DIET: Your first meal following the procedure should be a light meal and then it is ok to progress to your normal diet. A half-sandwich or bowl of soup is an example of a good first meal. Heavy or fried foods are harder to digest and may make you feel nauseous or bloated. Drink plenty of fluids but you should avoid alcoholic beverages for 24 hours. If you had a esophageal dilation, please see attached instructions for diet.    ACTIVITY: Your care partner should take you home directly after the procedure. You should plan to take it easy, moving slowly for the rest of the day. You can resume normal activity the day after the procedure however YOU SHOULD NOT DRIVE, use power tools, machinery or perform tasks that involve climbing or major physical exertion for 24 hours (because of the sedation medicines used during the test).   SYMPTOMS TO REPORT IMMEDIATELY: A gastroenterologist can be reached at any hour. Please call [phone]  for any of the following symptoms:  Following lower endoscopy (colonoscopy, flexible sigmoidoscopy) Excessive amounts of blood in the stool  Significant tenderness, worsening of abdominal pains  Swelling of the abdomen that is new, acute  Fever of  100 or higher    FOLLOW UP:  If any biopsies were taken you will be contacted by phone or by letter within the next 1-3 weeks. Call [phone]  if you have not heard about the biopsies in 3 weeks.  Please also call with any specific questions about appointments or follow up tests.

## 2018-11-24 ENCOUNTER — Encounter (HOSPITAL_COMMUNITY): Payer: Self-pay | Admitting: Gastroenterology

## 2018-11-24 NOTE — Telephone Encounter (Signed)
Work note sent to the pt My Chart

## 2018-11-24 NOTE — Anesthesia Postprocedure Evaluation (Signed)
Anesthesia Post Note  Patient: MASIYAH ENGEN  Procedure(s) Performed: COLONOSCOPY WITH PROPOFOL (N/A ) ENDOSCOPIC MUCOSAL RESECTION (N/A ) HEMOSTASIS CONTROL HEMOSTASIS CLIP PLACEMENT HOT HEMOSTASIS (ARGON PLASMA COAGULATION/BICAP) (N/A )     Patient location during evaluation: PACU Anesthesia Type: General Level of consciousness: awake and alert Pain management: pain level controlled Vital Signs Assessment: post-procedure vital signs reviewed and stable Respiratory status: spontaneous breathing, nonlabored ventilation, respiratory function stable and patient connected to nasal cannula oxygen Cardiovascular status: blood pressure returned to baseline and stable Postop Assessment: no apparent nausea or vomiting Anesthetic complications: no    Last Vitals:  Vitals:   11/23/18 1620 11/23/18 1625  BP: (!) 154/66 134/67  Pulse: (!) 55 60  Resp: 19 18  Temp:    SpO2: 97% 99%    Last Pain:  Vitals:   11/23/18 1625  TempSrc:   PainSc: 2                  Zamara Cozad S

## 2018-11-24 NOTE — Telephone Encounter (Signed)
The pt states he is doing very well, no pain, fever or other symptoms.  He will call if he begins to have symptoms or questions.

## 2018-11-24 NOTE — Telephone Encounter (Signed)
Thanks for the update Patty. They asked me yesterday for a work note for today and since his procedure ended in the PM and he didn't get discharged until later in day, I feel that it is OK to proceed. Thanks. GM

## 2018-11-26 ENCOUNTER — Encounter: Payer: Self-pay | Admitting: Gastroenterology

## 2019-07-22 ENCOUNTER — Telehealth: Payer: Self-pay | Admitting: Gastroenterology

## 2019-07-22 NOTE — Telephone Encounter (Signed)
Patient is calling to schedule recall at hospital

## 2019-07-22 NOTE — Telephone Encounter (Signed)
The pt has been advised he is on my list to call and schedule in a few days

## 2019-07-29 ENCOUNTER — Ambulatory Visit: Payer: Self-pay

## 2019-08-02 ENCOUNTER — Ambulatory Visit: Payer: BC Managed Care – PPO | Attending: Family

## 2019-08-02 DIAGNOSIS — Z23 Encounter for immunization: Secondary | ICD-10-CM

## 2019-08-02 NOTE — Progress Notes (Signed)
   Covid-19 Vaccination Clinic  Name:  Christopher Byrd    MRN: [id number] DOB: February 12, 1971  08/02/2019  Christopher Byrd was observed post Covid-19 immunization for 15 minutes without incidence. He was provided with Vaccine Information Sheet and instruction to access the V-Safe system.   Christopher Byrd was instructed to call 911 with any severe reactions post vaccine: Marland Kitchen Difficulty breathing  . Swelling of your face and throat  . A fast heartbeat  . A bad rash all over your body  . Dizziness and weakness    Immunizations Administered    Name Date Dose VIS Date Route   Moderna COVID-19 Vaccine 08/02/2019  5:26 PM 0.5 mL 05/11/2019 Intramuscular   Manufacturer: Moderna   Lot: [id number]   [id number]

## 2019-09-07 ENCOUNTER — Ambulatory Visit: Payer: Self-pay

## 2020-05-08 ENCOUNTER — Other Ambulatory Visit: Payer: Self-pay

## 2020-05-08 ENCOUNTER — Other Ambulatory Visit: Payer: Self-pay | Admitting: Family Medicine

## 2020-05-08 ENCOUNTER — Ambulatory Visit: Payer: Self-pay

## 2020-05-08 DIAGNOSIS — M25511 Pain in right shoulder: Secondary | ICD-10-CM

## 2020-07-02 ENCOUNTER — Other Ambulatory Visit: Payer: BC Managed Care – PPO

## 2020-08-01 ENCOUNTER — Encounter: Payer: Self-pay | Admitting: Internal Medicine

## 2020-08-01 ENCOUNTER — Other Ambulatory Visit: Payer: Self-pay

## 2020-08-01 ENCOUNTER — Ambulatory Visit: Payer: BC Managed Care – PPO | Admitting: Internal Medicine

## 2020-08-01 VITALS — BP 128/86 | HR 71 | Temp 98.2°F | Ht 69.0 in | Wt 241.0 lb

## 2020-08-01 DIAGNOSIS — Z125 Encounter for screening for malignant neoplasm of prostate: Secondary | ICD-10-CM

## 2020-08-01 DIAGNOSIS — E785 Hyperlipidemia, unspecified: Secondary | ICD-10-CM

## 2020-08-01 DIAGNOSIS — K2100 Gastro-esophageal reflux disease with esophagitis, without bleeding: Secondary | ICD-10-CM | POA: Diagnosis not present

## 2020-08-01 DIAGNOSIS — M1712 Unilateral primary osteoarthritis, left knee: Secondary | ICD-10-CM

## 2020-08-01 DIAGNOSIS — E7800 Pure hypercholesterolemia, unspecified: Secondary | ICD-10-CM | POA: Diagnosis not present

## 2020-08-01 DIAGNOSIS — Z860100 Personal history of colon polyps, unspecified: Secondary | ICD-10-CM

## 2020-08-01 DIAGNOSIS — Z0000 Encounter for general adult medical examination without abnormal findings: Secondary | ICD-10-CM | POA: Diagnosis not present

## 2020-08-01 DIAGNOSIS — M2341 Loose body in knee, right knee: Secondary | ICD-10-CM

## 2020-08-01 LAB — BASIC METABOLIC PANEL
BUN: 22 mg/dL (ref 6–23)
CO2: 28 mEq/L (ref 19–32)
Calcium: 9.8 mg/dL (ref 8.4–10.5)
Chloride: 100 mEq/L (ref 96–112)
Creatinine, Ser: 1.54 mg/dL — ABNORMAL HIGH (ref 0.40–1.50)
GFR: 51.92 mL/min — ABNORMAL LOW (ref >60.00)
Glucose, Bld: 89 mg/dL (ref 70–99)
Potassium: 4.4 mEq/L (ref 3.5–5.1)
Sodium: 139 mEq/L (ref 135–145)

## 2020-08-01 LAB — HEPATIC FUNCTION PANEL
ALT: 34 U/L (ref 0–53)
AST: 23 U/L (ref 0–37)
Albumin: 4.4 g/dL (ref 3.5–5.2)
Alkaline Phosphatase: 95 U/L (ref 39–117)
Bilirubin, Direct: 0.2 mg/dL (ref 0.0–0.3)
Total Bilirubin: 1.2 mg/dL (ref 0.2–1.2)
Total Protein: 6.9 g/dL (ref 6.0–8.3)

## 2020-08-01 LAB — LIPID PANEL
Cholesterol: 174 mg/dL (ref 0–200)
HDL: 54.4 mg/dL (ref >39.00)
LDL Cholesterol: 95 mg/dL (ref 0–99)
NonHDL: 119.82
Total CHOL/HDL Ratio: 3
Triglycerides: 124 mg/dL (ref 0.0–149.0)
VLDL: 24.8 mg/dL (ref 0.0–40.0)

## 2020-08-01 LAB — CBC WITH DIFFERENTIAL/PLATELET
Basophils Absolute: 0 10*3/uL (ref 0.0–0.1)
Basophils Relative: 0.5 % (ref 0.0–3.0)
Eosinophils Absolute: 0.1 10*3/uL (ref 0.0–0.7)
Eosinophils Relative: 0.6 % (ref 0.0–5.0)
HCT: 44.4 % (ref 39.0–52.0)
Hemoglobin: 15.3 g/dL (ref 13.0–17.0)
Lymphocytes Relative: 17.2 % (ref 12.0–46.0)
Lymphs Abs: 1.7 10*3/uL (ref 0.7–4.0)
MCHC: 34.4 g/dL (ref 30.0–36.0)
MCV: 89 fl (ref 78.0–100.0)
Monocytes Absolute: 1 10*3/uL (ref 0.1–1.0)
Monocytes Relative: 9.5 % (ref 3.0–12.0)
Neutro Abs: 7.3 10*3/uL (ref 1.4–7.7)
Neutrophils Relative %: 72.2 % (ref 43.0–77.0)
Platelets: 212 10*3/uL (ref 150.0–400.0)
RBC: 4.99 Mil/uL (ref 4.22–5.81)
RDW: 13.6 % (ref 11.5–15.5)
WBC: 10.2 10*3/uL (ref 4.0–10.5)

## 2020-08-01 LAB — PSA: PSA: 0.29 ng/mL (ref 0.10–4.00)

## 2020-08-01 LAB — TSH: TSH: 1.33 u[IU]/mL (ref 0.35–4.50)

## 2020-08-01 MED ORDER — ESOMEPRAZOLE MAGNESIUM 40 MG PO CPDR: 40.0000 mg | DELAYED_RELEASE_CAPSULE | Freq: Every day | ORAL | 1 refills | Status: DC

## 2020-08-01 NOTE — Patient Instructions (Signed)

## 2020-08-01 NOTE — Progress Notes (Signed)
Subjective:  Patient ID: Christopher Byrd, male    DOB: [birth year]  Age: 50 y.o. MRN: [id number]  CC: Annual Exam  This visit occurred during the SARS-CoV-2 public health emergency.  Safety protocols were in place, including screening questions prior to the visit, additional usage of staff PPE, and extensive cleaning of exam room while observing appropriate contact time as indicated for disinfecting solutions.    HPI Christopher Byrd presents for a CPX.  His meds are filled by the [place]. He is active and dose not experience CP, DOE, palpitations, edema, or fatigue. He complains of chronic left knee pain and wants to see an ortho. He continue to c/o of heartburn and dose not get much symptom relief with pepcid.  History Christopher Byrd has a past medical history of Arthritis, GERD (gastroesophageal reflux disease), Hyperlipidemia, PTSD (post-traumatic stress disorder), PTSD (post-traumatic stress disorder), and Sleep apnea.   He has a past surgical history that includes Appendectomy (2002); Hand surgery (Left); cyst removal from rectum; Colonoscopy; Colonoscopy with propofol (N/A, 11/23/2018); Endoscopic mucosal resection (N/A, 11/23/2018); Hemostasis control (11/23/2018); Hemostasis clip placement (11/23/2018); and Hot hemostasis (N/A, 11/23/2018).   His family history includes Breast cancer in his mother; Cancer in an other family member; Crohn's disease in his father; Diabetes in his father and another family member; Hypertension in his father and another family member; Prostate cancer in his father; Sickle cell anemia in his brother.He reports that he has never smoked. He has never used smokeless tobacco. He reports current alcohol use of about 7.0 standard drinks of alcohol per week. He reports that he does not use drugs.  Outpatient Medications Prior to Visit  Medication Sig Dispense Refill  . atorvastatin (LIPITOR) 40 MG tablet TAKE 1 TABLET BY MOUTH EVERY DAY 90 tablet 1  . baclofen (LIORESAL) 10 MG  tablet Take 10 mg by mouth 2 (two) times daily as needed.     Marland Kitchen buPROPion (WELLBUTRIN SR) 150 MG 12 hr tablet Take 150 mg by mouth 2 (two) times daily.  5  . clonazePAM (KLONOPIN) 1 MG tablet 1 mg at bedtime.     . fluticasone (FLONASE) 50 MCG/ACT nasal spray Place 1 spray into both nostrils daily.     Marland Kitchen loratadine (CLARITIN) 10 MG tablet Take 10 mg by mouth daily.    . naproxen sodium (ANAPROX) 550 MG tablet naproxen sodium 550 mg tablet    . traZODone (DESYREL) 50 MG tablet Take 200 mg by mouth at bedtime.  1   No facility-administered medications prior to visit.    ROS Review of Systems  Constitutional: Positive for unexpected weight change (wt gain). Negative for appetite change, diaphoresis and fatigue.  HENT: Negative.  Negative for sore throat, trouble swallowing and voice change.   Eyes: Negative.   Respiratory: Negative for cough, chest tightness, shortness of breath and wheezing.   Cardiovascular: Negative for chest pain, palpitations and leg swelling.  Gastrointestinal: Negative for abdominal pain, blood in stool, constipation, diarrhea, nausea and vomiting.  Endocrine: Negative.   Genitourinary: Negative.  Negative for difficulty urinating, hematuria, scrotal swelling and testicular pain.  Musculoskeletal: Positive for arthralgias. Negative for back pain and myalgias.  Skin: Negative.   Neurological: Negative.  Negative for dizziness and weakness.  Hematological: Negative for adenopathy. Does not bruise/bleed easily.  Psychiatric/Behavioral: Negative.     Objective:  BP 128/86   Pulse 71   Temp 98.2 F (36.8 C) (Oral)   Ht 5\' 9"  (1.753 m)   Wt 241 lb (  109.3 kg)   SpO2 96%   BMI 35.59 kg/m   Physical Exam Vitals reviewed.  Constitutional:      Appearance: Normal appearance.  HENT:     Nose: Nose normal.     Mouth/Throat:     Mouth: Mucous membranes are moist.  Eyes:     General: No scleral icterus.    Conjunctiva/sclera: Conjunctivae normal.   Cardiovascular:     Rate and Rhythm: Normal rate and regular rhythm.     Heart sounds: No murmur heard.   Pulmonary:     Effort: Pulmonary effort is normal.     Breath sounds: No stridor. No wheezing, rhonchi or rales.  Abdominal:     General: Abdomen is flat.     Palpations: There is no mass.     Tenderness: There is no abdominal tenderness. There is no guarding or rebound.     Hernia: No hernia is present. There is no hernia in the left inguinal area or right inguinal area.  Genitourinary:    Penis: Normal and circumcised. No discharge, swelling or lesions.      Testes: Normal.        Right: Mass, tenderness or swelling not present.        Left: Mass, tenderness or swelling not present.     Epididymis:     Right: Normal. No mass.     Left: Normal. No mass.     Prostate: Normal. Not enlarged, not tender and no nodules present.     Rectum: Normal. Guaiac result negative. No mass, tenderness, anal fissure, external hemorrhoid or internal hemorrhoid. Normal anal tone.  Musculoskeletal:     Cervical back: Neck supple.  Lymphadenopathy:     Cervical: No cervical adenopathy.     Lower Body: No right inguinal adenopathy. No left inguinal adenopathy.  Neurological:     Mental Status: He is alert.     Lab Results  Component Value Date   WBC 10.2 08/01/2020   HGB 15.3 08/01/2020   HCT 44.4 08/01/2020   PLT 212.0 08/01/2020   GLUCOSE 89 08/01/2020   CHOL 174 08/01/2020   TRIG 124.0 08/01/2020   HDL 54.40 08/01/2020   LDLCALC 95 08/01/2020   ALT 34 08/01/2020   AST 23 08/01/2020   NA 139 08/01/2020   K 4.4 08/01/2020   CL 100 08/01/2020   CREATININE 1.54 (H) 08/01/2020   BUN 22 08/01/2020   CO2 28 08/01/2020   TSH 1.33 08/01/2020   PSA 0.29 08/01/2020   INR 1.1 (H) 10/19/2018   MICROALBUR 7.2 (H) 12/05/2017    Assessment & Plan:   Christopher Byrd was seen today for annual exam.  Diagnoses and all orders for this visit:  Routine general medical examination at a health  care facility- Exam completed, labs reviewed, vaccines reviewed and updated, cancer screenings addressed. -     Lipid panel; Future -     PSA; Future -     PSA -     Lipid panel  Gastroesophageal reflux disease with esophagitis without hemorrhage- Will start a PPI for better symptom relief. -     Cancel: Ambulatory referral to Gastroenterology -     esomeprazole (NEXIUM) 40 MG capsule; Take 1 capsule (40 mg total) by mouth daily at 12 noon. -     CBC with Differential/Platelet; Future -     CBC with Differential/Platelet  Loose body of right knee  History of colonic polyps -     Ambulatory referral to Gastroenterology  Pure hypercholesterolemia- He has achieved his LDL goal and is doing well on the statin -     CBC with Differential/Platelet; Future -     Basic metabolic panel; Future -     Hepatic function panel; Future -     TSH; Future -     TSH -     Hepatic function panel -     Basic metabolic panel -     CBC with Differential/Platelet  Primary osteoarthritis of left knee -     Ambulatory referral to Orthopedic Surgery -     Basic metabolic panel; Future -     Basic metabolic panel   I am having Benton Tooker. Sato start on esomeprazole. I am also having him maintain his fluticasone, loratadine, atorvastatin, traZODone, clonazePAM, buPROPion, baclofen, and naproxen sodium.  Meds ordered this encounter  Medications  . esomeprazole (NEXIUM) 40 MG capsule    Sig: Take 1 capsule (40 mg total) by mouth daily at 12 noon.    Dispense:  90 capsule    Refill:  1     Follow-up: Return in about 6 months (around 01/29/2021).  Scarlette Calico, MD

## 2020-08-16 ENCOUNTER — Encounter: Payer: Self-pay | Admitting: Gastroenterology

## 2020-09-08 HISTORY — PX: SHOULDER SURGERY: SHX246

## 2020-10-27 ENCOUNTER — Other Ambulatory Visit: Payer: Self-pay

## 2020-10-27 ENCOUNTER — Ambulatory Visit (AMBULATORY_SURGERY_CENTER): Payer: Self-pay | Admitting: *Deleted

## 2020-10-27 VITALS — Ht 69.0 in | Wt 237.0 lb

## 2020-10-27 DIAGNOSIS — Z86010 Personal history of colonic polyps: Secondary | ICD-10-CM

## 2020-10-27 MED ORDER — PEG 3350-KCL-NA BICARB-NACL 420 G PO SOLR: 4000.0000 mL | Freq: Once | ORAL | 0 refills | Status: AC

## 2020-10-27 NOTE — Progress Notes (Signed)

## 2020-11-03 ENCOUNTER — Encounter: Payer: Self-pay | Admitting: Gastroenterology

## 2020-11-10 ENCOUNTER — Ambulatory Visit (AMBULATORY_SURGERY_CENTER): Payer: BC Managed Care – PPO | Admitting: Gastroenterology

## 2020-11-10 ENCOUNTER — Encounter: Payer: Self-pay | Admitting: Gastroenterology

## 2020-11-10 ENCOUNTER — Other Ambulatory Visit: Payer: Self-pay

## 2020-11-10 VITALS — BP 132/92 | HR 64 | Temp 98.1°F | Resp 13 | Ht 69.0 in | Wt 237.0 lb

## 2020-11-10 DIAGNOSIS — Z86010 Personal history of colonic polyps: Secondary | ICD-10-CM

## 2020-11-10 DIAGNOSIS — K635 Polyp of colon: Secondary | ICD-10-CM

## 2020-11-10 DIAGNOSIS — K529 Noninfective gastroenteritis and colitis, unspecified: Secondary | ICD-10-CM | POA: Diagnosis not present

## 2020-11-10 MED ORDER — SODIUM CHLORIDE 0.9 % IV SOLN: 500.0000 mL | Freq: Once | INTRAVENOUS | Status: DC

## 2020-11-10 NOTE — [place] (Signed)
Called to room to assist during endoscopic procedure.  Patient ID and intended procedure confirmed with present staff. Received instructions for my participation in the procedure from the performing physician.  

## 2020-11-10 NOTE — Progress Notes (Signed)
Pt's states no medical or surgical changes since previsit or office visit.  Check-in-tanacia  Vital signs-cw

## 2020-11-10 NOTE — Op Note (Signed)
[facility] Patient Name: Christopher Byrd Procedure Date: 11/10/2020 11:22 AM MRN: [id number] Endoscopist: Justice Britain , MD Age: 50 Referring MD:  Date of Birth: 10/21/1970 Gender: Male Account #: [id number] Procedure:                Colonoscopy Indications:              High risk colon cancer surveillance: Personal                            history of adenoma with villous component Medicines:                Monitored Anesthesia Care Procedure:                Pre-Anesthesia Assessment:                           - Prior to the procedure, a History and Physical                            was performed, and patient medications and                            allergies were reviewed. The patient's tolerance of                            previous anesthesia was also reviewed. The risks                            and benefits of the procedure and the sedation                            options and risks were discussed with the patient.                            All questions were answered, and informed consent                            was obtained. Prior Anticoagulants: The patient has                            taken no previous anticoagulant or antiplatelet                            agents. ASA Grade Assessment: II - A patient with                            mild systemic disease. After reviewing the risks                            and benefits, the patient was deemed in                            satisfactory condition to undergo the procedure.  After obtaining informed consent, the colonoscope                            was passed under direct vision. Throughout the                            procedure, the patient's blood pressure, pulse, and                            oxygen saturations were monitored continuously. The                            Olympus CF-HQ190 [id number]) Colonoscope was                            introduced through the  anus and advanced to the 5                            cm into the ileum. The colonoscopy was performed                            without difficulty. The patient tolerated the                            procedure. The quality of the bowel preparation was                            adequate. The terminal ileum, ileocecal valve,                            appendiceal orifice, and rectum were photographed. Scope In: 11:34:49 AM Scope Out: 11:50:17 AM Scope Withdrawal Time: 0 hours 10 minutes 41 seconds  Total Procedure Duration: 0 hours 15 minutes 28 seconds  Findings:                 The digital rectal exam findings include                            hemorrhoids. Pertinent negatives include no                            palpable rectal lesions.                           The terminal ileum and ileocecal valve appeared                            normal.                           A tattoo was seen in the descending colon. A                            post-polypectomy scar was found at the tattoo site  with some clip artifact. There was no evidence of                            true residual polyp tissue. This region was                            biopsied with a cold forceps for histology.                           Multiple small-mouthed diverticula were found in                            the sigmoid colon and descending colon.                           Normal mucosa was found in the entire colon                            otherwise.                           Non-bleeding non-thrombosed external and internal                            hemorrhoids were found during retroflexion, during                            perianal exam and during digital exam. The                            hemorrhoids were Grade II (internal hemorrhoids                            that prolapse but reduce spontaneously). Complications:            No immediate complications. Estimated Blood  Loss:     Estimated blood loss was minimal. Impression:               - Hemorrhoids found on digital rectal exam.                           - The examined portion of the ileum was normal.                           - A tattoo was seen in the descending colon. A                            post-polypectomy scar was found at the tattoo site.                            There was no evidence of residual polyp tissue but                            there was what appeared to be clip artifact.  Biopsied.                           - Normal mucosa in the entire examined colon                            otherwise.                           - Diverticulosis in the sigmoid colon and in the                            descending colon.                           - Non-bleeding non-thrombosed external and internal                            hemorrhoids. Recommendation:           - The patient will be observed post-procedure,                            until all discharge criteria are met.                           - Discharge patient to home.                           - Patient has a contact number available for                            emergencies. The signs and symptoms of potential                            delayed complications were discussed with the                            patient. Return to normal activities tomorrow.                            Written discharge instructions were provided to the                            patient.                           - High fiber diet.                           - Use FiberCon 1-2 tablets PO daily.                           - Continue present medications.                           - Await pathology results.                           -  Repeat colonoscopy in 3 years for surveillance                            most likely. If nothing is found at that point,                            then patient will remain on a 5-year follow up                             thereafter (unless dictated sooner by other polyps)                           - The findings and recommendations were discussed                            with the patient.                           - The findings and recommendations were discussed                            with the patient's family. Justice Britain, MD 11/10/2020 11:57:00 AM

## 2020-11-10 NOTE — Patient Instructions (Signed)
Handouts provided on diverticulosis, hemorrhoids and high-fiber diet.   Recommend a high-fiber diet.  Use FiberCon 1-2 tablets by mouth daily.   YOU HAD AN ENDOSCOPIC PROCEDURE TODAY AT [facility]:   Refer to the procedure report that was given to you for any specific questions about what was found during the examination.  If the procedure report does not answer your questions, please call your gastroenterologist to clarify.  If you requested that your care partner not be given the details of your procedure findings, then the procedure report has been included in a sealed envelope for you to review at your convenience later.  YOU SHOULD EXPECT: Some feelings of bloating in the abdomen. Passage of more gas than usual.  Walking can help get rid of the air that was put into your GI tract during the procedure and reduce the bloating. If you had a lower endoscopy (such as a colonoscopy or flexible sigmoidoscopy) you may notice spotting of blood in your stool or on the toilet paper. If you underwent a bowel prep for your procedure, you may not have a normal bowel movement for a few days.  Please Note:  You might notice some irritation and congestion in your nose or some drainage.  This is from the oxygen used during your procedure.  There is no need for concern and it should clear up in a day or so.  SYMPTOMS TO REPORT IMMEDIATELY:   Following lower endoscopy (colonoscopy or flexible sigmoidoscopy):  Excessive amounts of blood in the stool  Significant tenderness or worsening of abdominal pains  Swelling of the abdomen that is new, acute  Fever of 100F or higher  For urgent or emergent issues, a gastroenterologist can be reached at any hour by calling [phone]. Do not use MyChart messaging for urgent concerns.    DIET:  We do recommend a small meal at first, but then you may proceed to your regular diet.  Drink plenty of fluids but you should avoid alcoholic beverages for  24 hours.  ACTIVITY:  You should plan to take it easy for the rest of today and you should NOT DRIVE or use heavy machinery until tomorrow (because of the sedation medicines used during the test).    FOLLOW UP: Our staff will call the number listed on your records 48-72 hours following your procedure to check on you and address any questions or concerns that you may have regarding the information given to you following your procedure. If we do not reach you, we will leave a message.  We will attempt to reach you two times.  During this call, we will ask if you have developed any symptoms of COVID 19. If you develop any symptoms (ie: fever, flu-like symptoms, shortness of breath, cough etc.) before then, please call [phone].  If you test positive for Covid 19 in the 2 weeks post procedure, please call and report this information to [place].    If any biopsies were taken you will be contacted by phone or by letter within the next 1-3 weeks.  Please call us at [phone] if you have not heard about the biopsies in 3 weeks.    SIGNATURES/CONFIDENTIALITY: You and/or your care partner have signed paperwork which will be entered into your electronic medical record.  These signatures attest to the fact that that the information above on your After Visit Summary has been reviewed and is understood.  Full responsibility of the confidentiality of this discharge information lies  with you and/or your care-partner.

## 2020-11-10 NOTE — Progress Notes (Signed)
Report given to PACU, vss 

## 2020-11-14 ENCOUNTER — Telehealth: Payer: Self-pay

## 2020-11-14 NOTE — Telephone Encounter (Signed)
  Follow up Call-  Call back number 11/10/2020 08/20/2018  Post procedure Call Back phone  # [phone]  Permission to leave phone message Yes Yes  Some recent data might be hidden     Patient questions:  Do you have a fever, pain , or abdominal swelling? No. Pain Score  0 *  Have you tolerated food without any problems? Yes.    Have you been able to return to your normal activities? Yes.    Do you have any questions about your discharge instructions: Diet   No. Medications  No. Follow up visit  No.  Do you have questions or concerns about your Care? No.  Actions: * If pain score is 4 or above: No action needed, pain <4.  1. Have you developed a fever since your procedure? no  2.   Have you had an respiratory symptoms (SOB or cough) since your procedure? no  3.   Have you tested positive for COVID 19 since your procedure no  4.   Have you had any family members/close contacts diagnosed with the COVID 19 since your procedure?  no   If yes to any of these questions please route to Joylene John, RN and Joella Prince, RN

## 2020-11-15 ENCOUNTER — Other Ambulatory Visit: Payer: Self-pay

## 2020-11-15 ENCOUNTER — Ambulatory Visit (INDEPENDENT_AMBULATORY_CARE_PROVIDER_SITE_OTHER): Payer: BC Managed Care – PPO | Admitting: Otolaryngology

## 2020-11-15 VITALS — Temp 97.2°F

## 2020-11-15 DIAGNOSIS — J310 Chronic rhinitis: Secondary | ICD-10-CM | POA: Diagnosis not present

## 2020-11-15 DIAGNOSIS — J329 Chronic sinusitis, unspecified: Secondary | ICD-10-CM | POA: Diagnosis not present

## 2020-11-15 NOTE — Progress Notes (Signed)
HPI: Christopher Byrd is a 50 y.o. male who presents for evaluation of chronic sinus issues that he has had for over 20 years.  He has been self-medicating with Flonase and Zyrtec.  He does have history of obstructive sleep apnea and uses CPAP.  He describes pressure in the head as well as nasal congestion that alternates from side to side.  He is also had thick yellow-green mucus discharge from his nose.  He has not been on any antibiotics recently. Of note he uses Flonase 2-3 times a day.  Past Medical History:  Diagnosis Date  . Allergy   . Arthritis    knee back  . GERD (gastroesophageal reflux disease)   . Hyperlipidemia   . PTSD (post-traumatic stress disorder)   . PTSD (post-traumatic stress disorder)   . Sleep apnea    cpap usuage   Past Surgical History:  Procedure Laterality Date  . APPENDECTOMY  2002  . COLONOSCOPY    . COLONOSCOPY WITH PROPOFOL N/A 11/23/2018   Procedure: COLONOSCOPY WITH PROPOFOL;  Surgeon: Rush Landmark Telford Nab., MD;  Location: [place];  Service: Gastroenterology;  Laterality: N/A;  . cyst removal from rectum    . ENDOSCOPIC MUCOSAL RESECTION N/A 11/23/2018   Procedure: ENDOSCOPIC MUCOSAL RESECTION;  Surgeon: Rush Landmark Telford Nab., MD;  Location: [place];  Service: Gastroenterology;  Laterality: N/A;  endo loop  . HAND SURGERY Left    fracture  . HEMOSTASIS CLIP PLACEMENT  11/23/2018   Procedure: HEMOSTASIS CLIP PLACEMENT;  Surgeon: Irving Copas., MD;  Location: [place];  Service: Gastroenterology;;  . HEMOSTASIS CONTROL  11/23/2018   Procedure: HEMOSTASIS CONTROL;  Surgeon: Irving Copas., MD;  Location: [place];  Service: Gastroenterology;;  . HOT HEMOSTASIS N/A 11/23/2018   Procedure: HOT HEMOSTASIS (ARGON PLASMA COAGULATION/BICAP);  Surgeon: Irving Copas., MD;  Location: [place];  Service: Gastroenterology;  Laterality: N/A;  . POLYPECTOMY    . SHOULDER SURGERY Right 09/2020   Social History    Socioeconomic History  . Marital status: Married    Spouse name: Not on file  . Number of children: 2  . Years of education: Not on file  . Highest education level: Not on file  Occupational History  . Not on file  Tobacco Use  . Smoking status: Never Smoker  . Smokeless tobacco: Never Used  Vaping Use  . Vaping Use: Never used  Substance and Sexual Activity  . Alcohol use: Yes    Alcohol/week: 7.0 standard drinks    Types: 7 Glasses of wine per week  . Drug use: No  . Sexual activity: Yes    Partners: Female  Other Topics Concern  . Not on file  Social History Narrative   Caffienated drinks-no   Seat belt use often-yes   Regular Exercise-yes   Smoke alarm in the home-yes   Firearms/guns in the home-yes   History of physical abuse-no               Social Determinants of Health   Financial Resource Strain: Not on file  Food Insecurity: Not on file  Transportation Needs: Not on file  Physical Activity: Not on file  Stress: Not on file  Social Connections: Not on file   Family History  Problem Relation Age of Onset  . Breast cancer Mother   . Hypertension Father   . Diabetes Father   . Prostate cancer Father   . Crohn's disease Father   . Sickle cell anemia Brother   .  Diabetes Other   . Hypertension Other   . Cancer Other        Prostate and Breast Cancer  . Alcohol abuse Neg Hx   . Drug abuse Neg Hx   . Heart disease Neg Hx   . Hyperlipidemia Neg Hx   . Stroke Neg Hx   . Colon cancer Neg Hx   . Colon polyps Neg Hx   . Rectal cancer Neg Hx   . Stomach cancer Neg Hx   . Inflammatory bowel disease Neg Hx   . Liver disease Neg Hx   . Pancreatic cancer Neg Hx    Allergies  Allergen Reactions  . Simvastatin     Muscle aches   Prior to Admission medications   Medication Sig Start Date End Date Taking? Authorizing Provider  atorvastatin (LIPITOR) 40 MG tablet TAKE 1 TABLET BY MOUTH EVERY DAY 11/27/17   Janith Lima, MD  baclofen (LIORESAL) 10  MG tablet Take 10 mg by mouth 2 (two) times daily as needed.     [provider]  buPROPion (WELLBUTRIN SR) 150 MG 12 hr tablet Take 150 mg by mouth 2 (two) times daily. 11/16/17   [provider]  clonazePAM Bobbye [place]) 2 MG tablet Take by mouth. 09/05/20   [provider]  esomeprazole (NEXIUM) 40 MG capsule Take 1 capsule (40 mg total) by mouth daily at 12 noon. 08/01/20   Janith Lima, MD  fluticasone (FLONASE) 50 MCG/ACT nasal spray Place 1 spray into both nostrils daily.  01/11/14   [provider]  hydrOXYzine (ATARAX/VISTARIL) 25 MG tablet Take 100 mg by mouth at bedtime. 10/02/20   [provider]  loratadine (CLARITIN) 10 MG tablet Take 10 mg by mouth daily.    [provider]  Lurasidone HCl (LATUDA) 120 MG TABS Latuda 120 mg tablet  TAKE 1 TABLET BY MOUTH EVERY DAY WITH SUPPER    [provider]  meloxicam (MOBIC) 15 MG tablet     [provider]  methocarbamol (ROBAXIN) 500 MG tablet methocarbamol 500 mg tablet  Take 1 tablet every day by oral route as directed for 30 days. Patient not taking: No sig reported    [provider]  naproxen (NAPROSYN) 500 MG tablet Take 500 mg by mouth 2 (two) times daily as needed. Patient not taking: Reported on 11/10/2020 08/23/20   [provider]  ondansetron (ZOFRAN-ODT) 4 MG disintegrating tablet ondansetron 4 mg disintegrating tablet  Place 1 tablet every 8 hours by translingual route as needed.    [provider]  traZODone (DESYREL) 50 MG tablet Take 200 mg by mouth at bedtime. 10/14/17   [provider]     Positive ROS: Otherwise negative  All other systems have been reviewed and were otherwise negative with the exception of those mentioned in the HPI and as above.  Physical Exam: Constitutional: Alert, well-appearing, no acute distress Ears: External ears without lesions or tenderness. Ear canals are clear bilaterally with intact, clear  TMs.  Nasal: External nose without lesions. Septum is deviated to the right.  After decongesting the nose nasal endoscopy was performed and on nasal endoscopy he has diffuse mucosal swelling intranasally.  No polyps were noted but he has thick mucus discharge from both middle meatus regions..  The nasopharynx was clear. Oral: Lips and gums without lesions. Tongue and palate mucosa without lesions. Posterior oropharynx clear. Neck: No palpable adenopathy or masses Respiratory: Breathing comfortably  Skin: No facial/neck lesions or rash noted.  Procedures  Assessment: Chronic rhinitis with history of chronic sinusitis.  Plan: Placed him on Augmentin 875 mg twice daily for 2 weeks.  He will continue with his Flonase on a regular basis 2 sprays each nostril at night. We will plan on obtaining a CT scan of the sinuses in 3 to 4 weeks and follow-up here in 5 to 6 weeks.  Radene Journey, MD

## 2020-11-15 NOTE — Addendum Note (Signed)
Addended by: Macy Mis on: 11/15/2020 03:59 PM   Modules accepted: Orders

## 2020-11-17 ENCOUNTER — Encounter: Payer: Self-pay | Admitting: Gastroenterology

## 2020-12-13 ENCOUNTER — Ambulatory Visit
Admission: RE | Admit: 2020-12-13 | Discharge: 2020-12-13 | Disposition: A | Discharge status: Home / Self Care | Payer: BC Managed Care – PPO | Source: Ambulatory Visit | Attending: Otolaryngology | Admitting: Otolaryngology

## 2020-12-13 ENCOUNTER — Other Ambulatory Visit: Payer: Self-pay

## 2020-12-13 DIAGNOSIS — J329 Chronic sinusitis, unspecified: Secondary | ICD-10-CM

## 2020-12-20 ENCOUNTER — Other Ambulatory Visit: Payer: Self-pay

## 2020-12-20 ENCOUNTER — Ambulatory Visit (INDEPENDENT_AMBULATORY_CARE_PROVIDER_SITE_OTHER): Payer: BC Managed Care – PPO | Admitting: Otolaryngology

## 2020-12-20 DIAGNOSIS — J343 Hypertrophy of nasal turbinates: Secondary | ICD-10-CM

## 2020-12-20 DIAGNOSIS — J310 Chronic rhinitis: Secondary | ICD-10-CM | POA: Diagnosis not present

## 2020-12-20 DIAGNOSIS — J342 Deviated nasal septum: Secondary | ICD-10-CM | POA: Diagnosis not present

## 2020-12-20 NOTE — Progress Notes (Signed)
HPI: Christopher Byrd is a 50 y.o. male who returns today for evaluation of chronic sinus complaints.  He has history of obstructive sleep apnea and has been using nasal CPAP for over 6 years.  He has intermittent nasal obstruction which alternates from side to side as well as a lot of drainage as well as pressure in his head that he thought was related to his sinuses.  He returns today following a CT scan of the sinuses to review this. I reviewed the CT scan with the patient in the office today and this showed clear paranasal sinuses with widely patent nasofrontal ducts bilaterally and no significant sinus disease and noted.  He did have slight septal deviation to the left with turbinate per trophy but no signs of infection or sinus issues on the CT scan He has been using nasal steroid spray regularly at night and also the saline rinses and doing a little bit better.  He also takes antihistamines as needed.Marland Kitchen NKDA  Past Medical History:  Diagnosis Date   Allergy    Arthritis    knee back   GERD (gastroesophageal reflux disease)    Hyperlipidemia    PTSD (post-traumatic stress disorder)    PTSD (post-traumatic stress disorder)    Sleep apnea    cpap usuage   Past Surgical History:  Procedure Laterality Date   APPENDECTOMY  2002   COLONOSCOPY     COLONOSCOPY WITH PROPOFOL N/A 11/23/2018   Procedure: COLONOSCOPY WITH PROPOFOL;  Surgeon: Rush Landmark Telford Nab., MD;  Location: [facility];  Service: Gastroenterology;  Laterality: N/A;   cyst removal from rectum     ENDOSCOPIC MUCOSAL RESECTION N/A 11/23/2018   Procedure: ENDOSCOPIC MUCOSAL RESECTION;  Surgeon: Rush Landmark Telford Nab., MD;  Location: [place];  Service: Gastroenterology;  Laterality: N/A;  endo loop   HAND SURGERY Left    fracture   HEMOSTASIS CLIP PLACEMENT  11/23/2018   Procedure: HEMOSTASIS CLIP PLACEMENT;  Surgeon: Irving Copas., MD;  Location: [place];  Service: Gastroenterology;;   HEMOSTASIS CONTROL   11/23/2018   Procedure: HEMOSTASIS CONTROL;  Surgeon: Irving Copas., MD;  Location: [place];  Service: Gastroenterology;;   HOT HEMOSTASIS N/A 11/23/2018   Procedure: HOT HEMOSTASIS (ARGON PLASMA COAGULATION/BICAP);  Surgeon: Irving Copas., MD;  Location: [place];  Service: Gastroenterology;  Laterality: N/A;   POLYPECTOMY     SHOULDER SURGERY Right 09/2020   Social History   Socioeconomic History   Marital status: Married    Spouse name: Not on file   Number of children: 2   Years of education: Not on file   Highest education level: Not on file  Occupational History   Not on file  Tobacco Use   Smoking status: Never   Smokeless tobacco: Never  Vaping Use   Vaping Use: Never used  Substance and Sexual Activity   Alcohol use: Yes    Alcohol/week: 7.0 standard drinks    Types: 7 Glasses of wine per week   Drug use: No   Sexual activity: Yes    Partners: Female  Other Topics Concern   Not on file  Social History Narrative   Caffienated drinks-no   Seat belt use often-yes   Regular Exercise-yes   Smoke alarm in the home-yes   Firearms/guns in the home-yes   History of physical abuse-no               Social Determinants of Health   Financial Resource Strain: Not on file  Food Insecurity: Not on file  Transportation Needs: Not on file  Physical Activity: Not on file  Stress: Not on file  Social Connections: Not on file   Family History  Problem Relation Age of Onset   Breast cancer Mother    Hypertension Father    Diabetes Father    Prostate cancer Father    Crohn's disease Father    Sickle cell anemia Brother    Diabetes Other    Hypertension Other    Cancer Other        Prostate and Breast Cancer   Alcohol abuse Neg Hx    Drug abuse Neg Hx    Heart disease Neg Hx    Hyperlipidemia Neg Hx    Stroke Neg Hx    Colon cancer Neg Hx    Colon polyps Neg Hx    Rectal cancer Neg Hx    Stomach cancer Neg Hx    Inflammatory bowel  disease Neg Hx    Liver disease Neg Hx    Pancreatic cancer Neg Hx    Allergies  Allergen Reactions   Simvastatin     Muscle aches   Prior to Admission medications   Medication Sig Start Date End Date Taking? Authorizing Provider  atorvastatin (LIPITOR) 40 MG tablet TAKE 1 TABLET BY MOUTH EVERY DAY 11/27/17   Janith Lima, MD  baclofen (LIORESAL) 10 MG tablet Take 10 mg by mouth 2 (two) times daily as needed.     [provider]  buPROPion (WELLBUTRIN SR) 150 MG 12 hr tablet Take 150 mg by mouth 2 (two) times daily. 11/16/17   [provider]  clonazePAM Bobbye [place]) 2 MG tablet Take by mouth. 09/05/20   [provider]  esomeprazole (NEXIUM) 40 MG capsule Take 1 capsule (40 mg total) by mouth daily at 12 noon. 08/01/20   Janith Lima, MD  fluticasone (FLONASE) 50 MCG/ACT nasal spray Place 1 spray into both nostrils daily.  01/11/14   [provider]  hydrOXYzine (ATARAX/VISTARIL) 25 MG tablet Take 100 mg by mouth at bedtime. 10/02/20   [provider]  loratadine (CLARITIN) 10 MG tablet Take 10 mg by mouth daily.    [provider]  Lurasidone HCl (LATUDA) 120 MG TABS Latuda 120 mg tablet  TAKE 1 TABLET BY MOUTH EVERY DAY WITH SUPPER    [provider]  meloxicam (MOBIC) 15 MG tablet     [provider]  methocarbamol (ROBAXIN) 500 MG tablet methocarbamol 500 mg tablet  Take 1 tablet every day by oral route as directed for 30 days. Patient not taking: No sig reported    [provider]  naproxen (NAPROSYN) 500 MG tablet Take 500 mg by mouth 2 (two) times daily as needed. Patient not taking: Reported on 11/10/2020 08/23/20   [provider]  ondansetron (ZOFRAN-ODT) 4 MG disintegrating tablet ondansetron 4 mg disintegrating tablet  Place 1 tablet every 8 hours by translingual route as needed.    [provider]  traZODone (DESYREL) 50 MG tablet Take 200 mg by mouth at bedtime. 10/14/17   [provider]     Positive ROS: Otherwise negative  All other systems have been reviewed and were otherwise negative with the exception of those mentioned in the HPI and as above.  Physical Exam: Constitutional: Alert, well-appearing, no acute distress Ears: External ears without lesions or tenderness. Ear canals are clear bilaterally with intact, clear TMs.  Nasal: External nose without lesions. Septum deviated to  the left with compensatory hypertrophy of the right inferior turbinate.  On exam today he describes more congestion on the right side.  Both middle meatus regions are clear..  Oral: Lips and gums without lesions. Tongue and palate mucosa without lesions. Posterior oropharynx clear. Neck: No palpable adenopathy or masses Cardiac exam: Regular rate and rhythm without murmur Lungs: Clear to auscultation. Respiratory: Breathing comfortably  Skin: No facial/neck lesions or rash noted.  Procedures  Assessment: Chronic rhinitis with nasal obstruction and septal deviation and turbinate hypertrophy. No clinical evidence of significant sinus disease on exam or CT scan.  Plan: I discussed with him concerning regular use of nasal steroid spray and saline rinses that should help some. If he continues to have problems he would probably benefit from septoplasty and turbinate reductions to help improve his nasal airway however this will require hospitalization since he has obstructive sleep apnea and overnight nasal packing. He will call us back if he decides to pursue surgery.   Radene Journey, MD

## 2021-01-23 ENCOUNTER — Other Ambulatory Visit: Payer: Self-pay | Admitting: Internal Medicine

## 2021-01-23 DIAGNOSIS — K2100 Gastro-esophageal reflux disease with esophagitis, without bleeding: Secondary | ICD-10-CM

## 2021-07-20 ENCOUNTER — Other Ambulatory Visit: Payer: Self-pay | Admitting: Internal Medicine

## 2021-07-20 DIAGNOSIS — K2100 Gastro-esophageal reflux disease with esophagitis, without bleeding: Secondary | ICD-10-CM

## 2021-12-25 ENCOUNTER — Encounter: Payer: Self-pay | Admitting: Internal Medicine

## 2021-12-25 ENCOUNTER — Ambulatory Visit (INDEPENDENT_AMBULATORY_CARE_PROVIDER_SITE_OTHER): Payer: BC Managed Care – PPO | Admitting: Internal Medicine

## 2021-12-25 VITALS — BP 122/84 | HR 102 | Temp 98.5°F | Ht 69.0 in | Wt 245.0 lb

## 2021-12-25 DIAGNOSIS — K2100 Gastro-esophageal reflux disease with esophagitis, without bleeding: Secondary | ICD-10-CM

## 2021-12-25 DIAGNOSIS — Z0000 Encounter for general adult medical examination without abnormal findings: Secondary | ICD-10-CM | POA: Diagnosis not present

## 2021-12-25 DIAGNOSIS — E7800 Pure hypercholesterolemia, unspecified: Secondary | ICD-10-CM

## 2021-12-25 DIAGNOSIS — M5106 Intervertebral disc disorders with myelopathy, lumbar region: Secondary | ICD-10-CM

## 2021-12-25 DIAGNOSIS — M5416 Radiculopathy, lumbar region: Secondary | ICD-10-CM | POA: Diagnosis not present

## 2021-12-25 LAB — CBC WITH DIFFERENTIAL/PLATELET
Basophils Absolute: 0 10*3/uL (ref 0.0–0.1)
Basophils Relative: 0.4 % (ref 0.0–3.0)
Eosinophils Absolute: 0 10*3/uL (ref 0.0–0.7)
Eosinophils Relative: 0.5 % (ref 0.0–5.0)
HCT: 43.8 % (ref 39.0–52.0)
Hemoglobin: 14.6 g/dL (ref 13.0–17.0)
Lymphocytes Relative: 20.4 % (ref 12.0–46.0)
Lymphs Abs: 1.4 10*3/uL (ref 0.7–4.0)
MCHC: 33.3 g/dL (ref 30.0–36.0)
MCV: 90.1 fl (ref 78.0–100.0)
Monocytes Absolute: 0.6 10*3/uL (ref 0.1–1.0)
Monocytes Relative: 8.7 % (ref 3.0–12.0)
Neutro Abs: 4.7 10*3/uL (ref 1.4–7.7)
Neutrophils Relative %: 70 % (ref 43.0–77.0)
Platelets: 198 10*3/uL (ref 150.0–400.0)
RBC: 4.86 Mil/uL (ref 4.22–5.81)
RDW: 13.9 % (ref 11.5–15.5)
WBC: 6.7 10*3/uL (ref 4.0–10.5)

## 2021-12-25 LAB — LIPID PANEL
Cholesterol: 173 mg/dL (ref 0–200)
HDL: 56 mg/dL (ref >39.00)
LDL Cholesterol: 103 mg/dL — ABNORMAL HIGH (ref 0–99)
NonHDL: 116.78
Total CHOL/HDL Ratio: 3
Triglycerides: 68 mg/dL (ref 0.0–149.0)
VLDL: 13.6 mg/dL (ref 0.0–40.0)

## 2021-12-25 LAB — BASIC METABOLIC PANEL
BUN: 19 mg/dL (ref 6–23)
CO2: 27 mEq/L (ref 19–32)
Calcium: 9.6 mg/dL (ref 8.4–10.5)
Chloride: 103 mEq/L (ref 96–112)
Creatinine, Ser: 1.14 mg/dL (ref 0.40–1.50)
GFR: 73.76 mL/min (ref >60.00)
Glucose, Bld: 91 mg/dL (ref 70–99)
Potassium: 4.5 mEq/L (ref 3.5–5.1)
Sodium: 138 mEq/L (ref 135–145)

## 2021-12-25 LAB — PSA: PSA: 0.32 ng/mL (ref 0.10–4.00)

## 2021-12-25 LAB — HEPATIC FUNCTION PANEL
ALT: 33 U/L (ref 0–53)
AST: 31 U/L (ref 0–37)
Albumin: 4.6 g/dL (ref 3.5–5.2)
Alkaline Phosphatase: 106 U/L (ref 39–117)
Bilirubin, Direct: 0.2 mg/dL (ref 0.0–0.3)
Total Bilirubin: 1.1 mg/dL (ref 0.2–1.2)
Total Protein: 7.3 g/dL (ref 6.0–8.3)

## 2021-12-25 NOTE — Patient Instructions (Signed)
Health Maintenance, Male Adopting a healthy lifestyle and getting preventive care are important in promoting health and wellness. Ask your health care provider about: The right schedule for you to have regular tests and exams. Things you can do on your own to prevent diseases and keep yourself healthy. What should I know about diet, weight, and exercise? Eat a healthy diet  Eat a diet that includes plenty of vegetables, fruits, low-fat dairy products, and lean protein. Do not eat a lot of foods that are high in solid fats, added sugars, or sodium. Maintain a healthy weight Body mass index (BMI) is a measurement that can be used to identify possible weight problems. It estimates body fat based on height and weight. Your health care provider can help determine your BMI and help you achieve or maintain a healthy weight. Get regular exercise Get regular exercise. This is one of the most important things you can do for your health. Most adults should: Exercise for at least 150 minutes each week. The exercise should increase your heart rate and make you sweat (moderate-intensity exercise). Do strengthening exercises at least twice a week. This is in addition to the moderate-intensity exercise. Spend less time sitting. Even light physical activity can be beneficial. Watch cholesterol and blood lipids Have your blood tested for lipids and cholesterol at 53 years of age, then have this test every 5 years. You may need to have your cholesterol levels checked more often if: Your lipid or cholesterol levels are high. You are older than 53 years of age. You are at high risk for heart disease. What should I know about cancer screening? Many types of cancers can be detected early and may often be prevented. Depending on your health history and family history, you may need to have cancer screening at various ages. This may include screening for: Colorectal cancer. Prostate cancer. Skin cancer. Lung  cancer. What should I know about heart disease, diabetes, and high blood pressure? Blood pressure and heart disease High blood pressure causes heart disease and increases the risk of stroke. This is more likely to develop in people who have high blood pressure readings or are overweight. Talk with your health care provider about your target blood pressure readings. Have your blood pressure checked: Every 3-5 years if you are 18-39 years of age. Every year if you are 40 years old or older. If you are between the ages of 65 and 75 and are a current or former smoker, ask your health care provider if you should have a one-time screening for abdominal aortic aneurysm (AAA). Diabetes Have regular diabetes screenings. This checks your fasting blood sugar level. Have the screening done: Once every three years after age 45 if you are at a normal weight and have a low risk for diabetes. More often and at a younger age if you are overweight or have a high risk for diabetes. What should I know about preventing infection? Hepatitis B If you have a higher risk for hepatitis B, you should be screened for this virus. Talk with your health care provider to find out if you are at risk for hepatitis B infection. Hepatitis C Blood testing is recommended for: Everyone born from 1945 through 1965. Anyone with known risk factors for hepatitis C. Sexually transmitted infections (STIs) You should be screened each year for STIs, including gonorrhea and chlamydia, if: You are sexually active and are younger than 53 years of age. You are older than 53 years of age and your   health care provider tells you that you are at risk for this type of infection. Your sexual activity has changed since you were last screened, and you are at increased risk for chlamydia or gonorrhea. Ask your health care provider if you are at risk. Ask your health care provider about whether you are at high risk for HIV. Your health care provider  may recommend a prescription medicine to help prevent HIV infection. If you choose to take medicine to prevent HIV, you should first get tested for HIV. You should then be tested every 3 months for as long as you are taking the medicine. Follow these instructions at home: Alcohol use Do not drink alcohol if your health care provider tells you not to drink. If you drink alcohol: Limit how much you have to 0-2 drinks a day. Know how much alcohol is in your drink. In the [place] one drink equals one 12 oz bottle of beer (355 mL), one 5 oz glass of wine (148 mL), or one 1 oz glass of hard liquor (44 mL). Lifestyle Do not use any products that contain nicotine or tobacco. These products include cigarettes, chewing tobacco, and vaping devices, such as e-cigarettes. If you need help quitting, ask your health care provider. Do not use street drugs. Do not share needles. Ask your health care provider for help if you need support or information about quitting drugs. General instructions Schedule regular health, dental, and eye exams. Stay current with your vaccines. Tell your health care provider if: You often feel depressed. You have ever been abused or do not feel safe at home. Summary Adopting a healthy lifestyle and getting preventive care are important in promoting health and wellness. Follow your health care provider's instructions about healthy diet, exercising, and getting tested or screened for diseases. Follow your health care provider's instructions on monitoring your cholesterol and blood pressure. This information is not intended to replace advice given to you by your health care provider. Make sure you discuss any questions you have with your health care provider. Document Revised: 10/16/2020 Document Reviewed: 10/16/2020 Elsevier Patient Education  2023 [employer]  

## 2021-12-25 NOTE — Progress Notes (Signed)
Subjective:  Patient ID: Christopher Byrd, male    DOB: 09/02/1970  Age: 51 y.o. MRN: [id number]  CC: Annual Exam and Back Pain   HPI [name] presents for a CPX and f/up -  He complains of a 39-monthhistory of worsening left lower back pain that radiates into his left lower extremity.  He also has left leg numbness but denies weakness or or tingling.  He has been receiving epidural steroid injections and only gets temporary relief.  He is active and denies chest pain, shortness of breath, or diaphoresis.  Outpatient Medications Prior to Visit  Medication Sig Dispense Refill   atorvastatin (LIPITOR) 40 MG tablet TAKE 1 TABLET BY MOUTH EVERY DAY 90 tablet 1   baclofen (LIORESAL) 10 MG tablet Take 10 mg by mouth 2 (two) times daily as needed.      buPROPion (WELLBUTRIN SR) 150 MG 12 hr tablet Take 150 mg by mouth 2 (two) times daily.  5   clonazePAM (KLONOPIN) 2 MG tablet Take by mouth.     esomeprazole (NEXIUM) 40 MG capsule TAKE 1 CAPSULE (40 MG TOTAL) BY MOUTH DAILY AT 12 NOON. 90 capsule 1   fluticasone (FLONASE) 50 MCG/ACT nasal spray Place 1 spray into both nostrils daily.      hydrOXYzine (ATARAX/VISTARIL) 25 MG tablet Take 100 mg by mouth at bedtime.     loratadine (CLARITIN) 10 MG tablet Take 10 mg by mouth daily.     Lurasidone HCl (LATUDA) 120 MG TABS Latuda 120 mg tablet  TAKE 1 TABLET BY MOUTH EVERY DAY WITH SUPPER     naproxen (NAPROSYN) 500 MG tablet Take 500 mg by mouth 2 (two) times daily as needed.     ondansetron (ZOFRAN-ODT) 4 MG disintegrating tablet ondansetron 4 mg disintegrating tablet  Place 1 tablet every 8 hours by translingual route as needed.     traZODone (DESYREL) 50 MG tablet Take 200 mg by mouth at bedtime.  1   meloxicam (MOBIC) 15 MG tablet      methocarbamol (ROBAXIN) 500 MG tablet methocarbamol 500 mg tablet  Take 1 tablet every day by oral route as directed for 30 days.     No facility-administered medications prior to visit.     ROS Review of Systems  Constitutional: Negative.  Negative for diaphoresis and fatigue.  HENT: Negative.    Eyes: Negative.   Respiratory:  Negative for cough, chest tightness, shortness of breath and wheezing.   Cardiovascular:  Negative for chest pain, palpitations and leg swelling.  Gastrointestinal:  Negative for abdominal pain, diarrhea and nausea.  Endocrine: Negative.   Genitourinary: Negative.  Negative for difficulty urinating.  Musculoskeletal:  Positive for back pain. Negative for arthralgias and myalgias.  Neurological:  Positive for numbness. Negative for dizziness and weakness.  Hematological:  Negative for adenopathy. Does not bruise/bleed easily.  Psychiatric/Behavioral: Negative.      Objective:  BP 122/84 (BP Location: Right Arm, Patient Position: Sitting, Cuff Size: Large)   Pulse (!) 102   Temp 98.5 F (36.9 C) (Oral)   Ht '5\' 9"'$  (1.753 m)   Wt 245 lb (111.1 kg)   SpO2 97%   BMI 36.18 kg/m   BP Readings from Last 3 Encounters:  12/25/21 122/84  11/10/20 (!) 132/92  08/01/20 128/86    Wt Readings from Last 3 Encounters:  12/25/21 245 lb (111.1 kg)  11/10/20 237 lb (107.5 kg)  10/27/20 237 lb (107.5 kg)    Physical Exam Vitals reviewed.  HENT:     Nose: Nose normal.     Mouth/Throat:     Mouth: Mucous membranes are moist.  Eyes:     General: No scleral icterus.    Conjunctiva/sclera: Conjunctivae normal.  Cardiovascular:     Rate and Rhythm: Normal rate and regular rhythm.     Heart sounds: No murmur heard. Pulmonary:     Effort: Pulmonary effort is normal.     Breath sounds: No stridor. No wheezing, rhonchi or rales.  Abdominal:     General: Abdomen is flat.     Palpations: There is no mass.     Tenderness: There is no abdominal tenderness. There is no guarding.     Hernia: No hernia is present. There is no hernia in the left inguinal area or right inguinal area.  Genitourinary:    Pubic Area: No rash.      Penis: Normal.       Testes: Normal.     Epididymis:     Right: Normal.     Left: Normal.     Prostate: Normal. Not enlarged, not tender and no nodules present.     Rectum: Normal. Guaiac result negative. No mass, tenderness, anal fissure, external hemorrhoid or internal hemorrhoid. Normal anal tone.  Musculoskeletal:     Cervical back: Neck supple.     Lumbar back: Normal range of motion. Positive left straight leg raise test. Negative right straight leg raise test.     Right lower leg: No edema.     Left lower leg: No edema.  Lymphadenopathy:     Cervical: No cervical adenopathy.     Lower Body: No right inguinal adenopathy. No left inguinal adenopathy.  Skin:    General: Skin is warm and dry.     Coloration: Skin is not pale.  Neurological:     Mental Status: He is alert.     Deep Tendon Reflexes: Reflexes normal.     Lab Results  Component Value Date   WBC 6.7 12/25/2021   HGB 14.6 12/25/2021   HCT 43.8 12/25/2021   PLT 198.0 12/25/2021   GLUCOSE 91 12/25/2021   CHOL 173 12/25/2021   TRIG 68.0 12/25/2021   HDL 56.00 12/25/2021   LDLCALC 103 (H) 12/25/2021   ALT 33 12/25/2021   AST 31 12/25/2021   NA 138 12/25/2021   K 4.5 12/25/2021   CL 103 12/25/2021   CREATININE 1.14 12/25/2021   BUN 19 12/25/2021   CO2 27 12/25/2021   TSH 1.33 08/01/2020   PSA 0.32 12/25/2021   INR 1.1 (H) 10/19/2018   MICROALBUR 7.2 (H) 12/05/2017    CT Maxillofacial WO CM  Result Date: 12/13/2020 CLINICAL DATA:  Chronic sinusitis. Fusion protocol. Difficulty breathing. EXAM: CT MAXILLOFACIAL WITHOUT CONTRAST TECHNIQUE: Multidetector CT images of the paranasal sinuses were obtained using the standard protocol without intravenous contrast. COMPARISON:  None. FINDINGS: Paranasal sinuses: Frontal: Normally aerated. Patent frontal sinus drainage pathways. Ethmoid: Normally aerated. Maxillary: Normally aerated. Sphenoid: Normally aerated. Patent sphenoethmoidal recesses. Right ostiomeatal unit: Patent. Left  ostiomeatal unit: Patent. Nasal passages: Patent. Nasal septum bows 3 mm towards the left. Anatomy: There is pneumatization superior to both anterior ethmoid notches. Symmetric and intact olfactory grooves and fovea ethmoidalis, Keros I (1-60m). Sellar sphenoid pneumatization pattern. No dehiscence of carotid or optic canals. No onodi cell. Other: Anterior ethmoid bulla on the left bulges into the inferior left frontal sinus. IMPRESSION: No evidence active or recent sinus inflammatory disease. Anatomic evaluation as above. Electronically Signed  By: Nelson Chimes M.D.   On: 12/13/2020 13:50    Assessment & Plan:   Ansen was seen today for annual exam and back pain.  Diagnoses and all orders for this visit:  Gastroesophageal reflux disease with esophagitis without hemorrhage- His symptoms are well controlled. -     Cancel: Lipid panel; Future -     CBC with Differential/Platelet; Future -     Basic metabolic panel; Future -     Basic metabolic panel -     CBC with Differential/Platelet  Routine general medical examination at a health care facility- Exam completed, labs reviewed, vaccines reviewed and updated, cancer screenings are up-to-date, patient education was given. -     PSA; Future -     PSA  Pure hypercholesterolemia- LDL goal achieved. Doing well on the statin  -     Cancel: Lipid panel; Future -     Hepatic function panel; Future -     Basic metabolic panel; Future -     Lipid panel; Future -     Lipid panel -     Basic metabolic panel -     Hepatic function panel  Left lumbar radiculitis -     MR Lumbar Spine Wo Contrast; Future  Lumbar disc herniation with myelopathy- I recommended an MRI to see if he has a disc herniation that would be amenable to microdiscectomy. -     MR Lumbar Spine Wo Contrast; Future   I have discontinued Javar Eshbach. Kanady's meloxicam and methocarbamol. I am also having him maintain his fluticasone, loratadine, atorvastatin, traZODone,  buPROPion, baclofen, hydrOXYzine, Latuda, naproxen, ondansetron, clonazePAM, and esomeprazole.  No orders of the defined types were placed in this encounter.    Follow-up: Return in about 6 months (around 06/27/2022).  Scarlette Calico, MD

## 2021-12-31 ENCOUNTER — Ambulatory Visit
Admission: RE | Admit: 2021-12-31 | Discharge: 2021-12-31 | Disposition: A | Discharge status: Home / Self Care | Payer: BC Managed Care – PPO | Source: Ambulatory Visit | Attending: Internal Medicine | Admitting: Internal Medicine

## 2021-12-31 DIAGNOSIS — M5416 Radiculopathy, lumbar region: Secondary | ICD-10-CM

## 2021-12-31 DIAGNOSIS — M5106 Intervertebral disc disorders with myelopathy, lumbar region: Secondary | ICD-10-CM

## 2022-01-01 ENCOUNTER — Telehealth: Payer: Self-pay

## 2022-01-01 ENCOUNTER — Other Ambulatory Visit: Payer: Self-pay | Admitting: Internal Medicine

## 2022-01-01 DIAGNOSIS — M48061 Spinal stenosis, lumbar region without neurogenic claudication: Secondary | ICD-10-CM | POA: Insufficient documentation

## 2022-01-01 NOTE — Telephone Encounter (Signed)
Critical-please see results Unexpected findings, possible cancer. Marzetta Board at [facility] notified

## 2022-12-30 ENCOUNTER — Ambulatory Visit (INDEPENDENT_AMBULATORY_CARE_PROVIDER_SITE_OTHER): Payer: BC Managed Care – PPO | Admitting: Internal Medicine

## 2022-12-30 ENCOUNTER — Encounter: Payer: Self-pay | Admitting: Internal Medicine

## 2022-12-30 VITALS — BP 116/76 | HR 54 | Temp 97.6°F | Resp 16 | Ht 69.0 in | Wt 244.0 lb

## 2022-12-30 DIAGNOSIS — Z23 Encounter for immunization: Secondary | ICD-10-CM | POA: Diagnosis not present

## 2022-12-30 DIAGNOSIS — M5136 Other intervertebral disc degeneration, lumbar region: Secondary | ICD-10-CM | POA: Diagnosis not present

## 2022-12-30 DIAGNOSIS — E7800 Pure hypercholesterolemia, unspecified: Secondary | ICD-10-CM

## 2022-12-30 DIAGNOSIS — K2100 Gastro-esophageal reflux disease with esophagitis, without bleeding: Secondary | ICD-10-CM

## 2022-12-30 DIAGNOSIS — E7841 Elevated Lipoprotein(a): Secondary | ICD-10-CM

## 2022-12-30 DIAGNOSIS — Z0001 Encounter for general adult medical examination with abnormal findings: Secondary | ICD-10-CM | POA: Diagnosis not present

## 2022-12-30 DIAGNOSIS — R001 Bradycardia, unspecified: Secondary | ICD-10-CM | POA: Diagnosis not present

## 2022-12-30 DIAGNOSIS — H6123 Impacted cerumen, bilateral: Secondary | ICD-10-CM

## 2022-12-30 DIAGNOSIS — Z0000 Encounter for general adult medical examination without abnormal findings: Secondary | ICD-10-CM

## 2022-12-30 LAB — BASIC METABOLIC PANEL
BUN: 20 mg/dL (ref 6–23)
CO2: 25 mEq/L (ref 19–32)
Calcium: 9.6 mg/dL (ref 8.4–10.5)
Chloride: 103 mEq/L (ref 96–112)
Creatinine, Ser: 1.13 mg/dL (ref 0.40–1.50)
GFR: 74.02 mL/min (ref >60.00)
Glucose, Bld: 92 mg/dL (ref 70–99)
Potassium: 4.5 mEq/L (ref 3.5–5.1)
Sodium: 137 mEq/L (ref 135–145)

## 2022-12-30 LAB — LIPID PANEL
Cholesterol: 191 mg/dL (ref 0–200)
HDL: 50.9 mg/dL (ref >39.00)
LDL Cholesterol: 122 mg/dL — ABNORMAL HIGH (ref 0–99)
NonHDL: 139.81
Total CHOL/HDL Ratio: 4
Triglycerides: 90 mg/dL (ref 0.0–149.0)
VLDL: 18 mg/dL (ref 0.0–40.0)

## 2022-12-30 LAB — HEPATIC FUNCTION PANEL
ALT: 36 U/L (ref 0–53)
AST: 35 U/L (ref 0–37)
Albumin: 4.6 g/dL (ref 3.5–5.2)
Alkaline Phosphatase: 92 U/L (ref 39–117)
Bilirubin, Direct: 0.3 mg/dL (ref 0.0–0.3)
Total Bilirubin: 1.6 mg/dL — ABNORMAL HIGH (ref 0.2–1.2)
Total Protein: 7.1 g/dL (ref 6.0–8.3)

## 2022-12-30 LAB — CBC WITH DIFFERENTIAL/PLATELET
Basophils Absolute: 0 10*3/uL (ref 0.0–0.1)
Basophils Relative: 0.4 % (ref 0.0–3.0)
Eosinophils Absolute: 0 10*3/uL (ref 0.0–0.7)
Eosinophils Relative: 0.7 % (ref 0.0–5.0)
HCT: 45.9 % (ref 39.0–52.0)
Hemoglobin: 15.1 g/dL (ref 13.0–17.0)
Lymphocytes Relative: 17.7 % (ref 12.0–46.0)
Lymphs Abs: 1.2 10*3/uL (ref 0.7–4.0)
MCHC: 33 g/dL (ref 30.0–36.0)
MCV: 90.6 fl (ref 78.0–100.0)
Monocytes Absolute: 0.5 10*3/uL (ref 0.1–1.0)
Monocytes Relative: 8.3 % (ref 3.0–12.0)
Neutro Abs: 4.8 10*3/uL (ref 1.4–7.7)
Neutrophils Relative %: 72.9 % (ref 43.0–77.0)
Platelets: 202 10*3/uL (ref 150.0–400.0)
RBC: 5.06 Mil/uL (ref 4.22–5.81)
RDW: 13.3 % (ref 11.5–15.5)
WBC: 6.5 10*3/uL (ref 4.0–10.5)

## 2022-12-30 LAB — PSA: PSA: 0.32 ng/mL (ref 0.10–4.00)

## 2022-12-30 LAB — TSH: TSH: 1.57 u[IU]/mL (ref 0.35–5.50)

## 2022-12-30 NOTE — Patient Instructions (Signed)
Bradycardia, Adult Bradycardia is a slower-than-normal heartbeat. A normal resting heart rate for an adult ranges from 60 to 100 beats per minute. With bradycardia, the resting heart rate is less than 60 beats per minute. Bradycardia can prevent enough oxygen from reaching certain areas of your body when you are active. It can be serious if it keeps enough oxygen from reaching your brain and other parts of your body. Bradycardia is not a problem for everyone. For some healthy adults, a slow resting heart rate is normal. What are the causes? This condition may be caused by: A problem with the heart, including: A problem with the heart's electrical system, such as a heart block. With a heart block, electrical signals between the chambers of the heart are partially or completely blocked, so they are not able to work as they should. A problem with the heart's natural pacemaker (sinus node). Heart disease. A heart attack. Heart damage. Lyme disease. A heart infection. A heart condition that is present at birth (congenital heart defect). Certain medicines that treat heart conditions. Certain conditions, such as hypothyroidism and obstructive sleep apnea. Problems with the balance of chemicals and other substances, like potassium, in the blood. Trauma. Radiation therapy. What increases the risk? You are more likely to develop this condition if you: Are age 65 or older. Have high blood pressure (hypertension), high cholesterol (hyperlipidemia), or diabetes. Drink heavily, use tobacco or nicotine products, or use drugs. What are the signs or symptoms? Symptoms of this condition include: Light-headedness. Feeling faint or fainting. Fatigue and weakness. Trouble with activity or exercise. Shortness of breath. Chest pain (angina). Drowsiness. Confusion. Dizziness. How is this diagnosed? This condition may be diagnosed based on: Your symptoms. Your medical history. A physical exam. During  the exam, your health care provider will listen to your heartbeat and check your pulse. To confirm the diagnosis, your health care provider may order tests, such as: Blood tests. An electrocardiogram (ECG). This test records the heart's electrical activity. The test can show how fast your heart is beating and whether the heartbeat is steady. A test in which you wear a portable device (event recorder or Holter monitor) to record your heart's electrical activity while you go about your day. An exercise test. How is this treated? Treatment for this condition depends on the cause of the condition and how severe your symptoms are. Treatment may involve: Treatment of the underlying condition. Changing your medicines or how much medicine you take. Having a small, battery-operated device called a pacemaker implanted under the skin. When bradycardia occurs, this device can be used to increase your heart rate and help your heart beat in a regular rhythm. Follow these instructions at home: Lifestyle Manage any health conditions that contribute to bradycardia as told by your health care provider. Follow a heart-healthy diet. A nutrition specialist (dietitian) can help educate you about healthy food options and changes. Follow an exercise program that is approved by your health care provider. Maintain a healthy weight. Try to reduce or manage your stress, such as with yoga or meditation. If you need help reducing stress, ask your health care provider. Do not use any products that contain nicotine or tobacco. These products include cigarettes, chewing tobacco, and vaping devices, such as e-cigarettes. If you need help quitting, ask your health care provider. Do not use illegal drugs. Alcohol use If you drink alcohol: Limit how much you have to: 0-1 drink a day for women who are not pregnant. 0-2 drinks a day   for men. Know how much alcohol is in a drink. In the [place] one drink equals one 12 oz bottle of  beer (355 mL), one 5 oz glass of wine (148 mL), or one 1 oz glass of hard liquor (44 mL). General instructions Take over-the-counter and prescription medicines only as told by your health care provider. Keep all follow-up visits. This is important. How is this prevented? In some cases, bradycardia may be prevented by: Treating underlying medical problems. Stopping behaviors or medicines that can trigger the condition. Contact a health care provider if: You feel light-headed or dizzy. You almost faint. You feel weak or are easily fatigued during physical activity. You experience confusion or have memory problems. Get help right away if: You faint. You have chest pains or an irregular heartbeat (palpitations). You have trouble breathing. These symptoms may represent a serious problem that is an emergency. Do not wait to see if the symptoms will go away. Get medical help right away. Call your local emergency services (911 in the [place] Do not drive yourself to the hospital. Summary Bradycardia is a slower-than-normal heartbeat. With bradycardia, the resting heart rate is less than 60 beats per minute. Treatment for this condition depends on the cause. Manage any health conditions that contribute to bradycardia as told by your health care provider. Do not use any products that contain nicotine or tobacco. These products include cigarettes, chewing tobacco, and vaping devices, such as e-cigarettes. Keep all follow-up visits. This is important. This information is not intended to replace advice given to you by your health care provider. Make sure you discuss any questions you have with your health care provider. Document Revised: 09/17/2020 Document Reviewed: 09/17/2020 Elsevier Patient Education  2024 [employer]vier Inc.  

## 2022-12-30 NOTE — Progress Notes (Unsigned)
Subjective:  Patient ID: Christopher Byrd, male    DOB: 25-Jun-1970  Age: 52 y.o. MRN: [id number]  CC: Annual Exam and Gastroesophageal Reflux   HPI Christopher Byrd presents for a CPX and f/up ----   Outpatient Medications Prior to Visit  Medication Sig Dispense Refill   atorvastatin (LIPITOR) 40 MG tablet TAKE 1 TABLET BY MOUTH EVERY DAY 90 tablet 1   baclofen (LIORESAL) 10 MG tablet Take 10 mg by mouth 2 (two) times daily as needed.      buPROPion (WELLBUTRIN SR) 150 MG 12 hr tablet Take 150 mg by mouth 2 (two) times daily.  5   clonazePAM (KLONOPIN) 2 MG tablet Take by mouth.     esomeprazole (NEXIUM) 40 MG capsule TAKE 1 CAPSULE (40 MG TOTAL) BY MOUTH DAILY AT 12 NOON. 90 capsule 1   fluticasone (FLONASE) 50 MCG/ACT nasal spray Place 1 spray into both nostrils daily.      hydrOXYzine (ATARAX/VISTARIL) 25 MG tablet Take 100 mg by mouth at bedtime.     loratadine (CLARITIN) 10 MG tablet Take 10 mg by mouth daily.     Lurasidone HCl (LATUDA) 120 MG TABS Latuda 120 mg tablet  TAKE 1 TABLET BY MOUTH EVERY DAY WITH SUPPER     naproxen (NAPROSYN) 500 MG tablet Take 500 mg by mouth 2 (two) times daily as needed.     ondansetron (ZOFRAN-ODT) 4 MG disintegrating tablet ondansetron 4 mg disintegrating tablet  Place 1 tablet every 8 hours by translingual route as needed.     traZODone (DESYREL) 50 MG tablet Take 200 mg by mouth at bedtime.  1   No facility-administered medications prior to visit.    ROS Review of Systems  Objective:  BP 116/76 (BP Location: Left Arm, Patient Position: Sitting, Cuff Size: Large)   Pulse (!) 54   Temp 97.6 F (36.4 C) (Oral)   Resp 16   Ht 5\' 9"  (1.753 m)   Wt 244 lb (110.7 kg)   SpO2 96%   BMI 36.03 kg/m   BP Readings from Last 3 Encounters:  12/30/22 116/76  12/25/21 122/84  11/10/20 (!) 132/92    Wt Readings from Last 3 Encounters:  12/30/22 244 lb (110.7 kg)  12/25/21 245 lb (111.1 kg)  11/10/20 237 lb (107.5 kg)    Physical  Exam Cardiovascular:     Rate and Rhythm: Regular rhythm. Bradycardia present.     Heart sounds: Normal heart sounds, S1 normal and S2 normal. No murmur heard.    Comments: EKG-  SB, 58 bpm Minimal LVH ST elevation with early repol (TWI in III and V1). No Q waves Abdominal:     Hernia: There is no hernia in the left inguinal area or right inguinal area.  Genitourinary:    Pubic Area: No rash.      Penis: Normal and circumcised.      Testes: Normal.     Epididymis:     Right: Normal.     Left: Normal.     Prostate: Not enlarged, not tender and no nodules present.     Rectum: Normal. Guaiac result negative. No mass, tenderness, anal fissure, external hemorrhoid or internal hemorrhoid. Normal anal tone.  Musculoskeletal:     Right lower leg: No edema.     Left lower leg: No edema.  Lymphadenopathy:     Lower Body: No right inguinal adenopathy. No left inguinal adenopathy.     Lab Results  Component Value Date   WBC  6.7 12/25/2021   HGB 14.6 12/25/2021   HCT 43.8 12/25/2021   PLT 198.0 12/25/2021   GLUCOSE 91 12/25/2021   CHOL 173 12/25/2021   TRIG 68.0 12/25/2021   HDL 56.00 12/25/2021   LDLCALC 103 (H) 12/25/2021   ALT 33 12/25/2021   AST 31 12/25/2021   NA 138 12/25/2021   K 4.5 12/25/2021   CL 103 12/25/2021   CREATININE 1.14 12/25/2021   BUN 19 12/25/2021   CO2 27 12/25/2021   TSH 1.33 08/01/2020   PSA 0.32 12/25/2021   INR 1.1 (H) 10/19/2018   MICROALBUR 7.2 (H) 12/05/2017    MR Lumbar Spine Wo Contrast  Result Date: 01/01/2022 CLINICAL DATA:  Chronic low back pain worsening with left lower extremity radiculopathy. EXAM: MRI LUMBAR SPINE WITHOUT CONTRAST TECHNIQUE: Multiplanar, multisequence MR imaging of the lumbar spine was performed. No intravenous contrast was administered. COMPARISON:  Lumbar spine MRI 08/09/2018 FINDINGS: Segmentation: Standard; the lowest formed disc space is designated L5-S1. Alignment:  Normal. Vertebrae: Vertebral body heights are  preserved. Background marrow signal is normal. There is no suspicious marrow signal abnormality. There is a Schmorl's node indenting the inferior L3 endplate with faint edema increased in conspicuity since 2020. Conus medullaris and cauda equina: Conus extends to the L1-L2 level. There is diffuse thickening of the cauda equina nerve roots, new since 2020. Paraspinal and other soft tissues: Unremarkable. Disc levels: There is disc desiccation and narrowing at L3-L4 through L5-S1, slightly progressed since 2020. T12-L1: No significant spinal canal or neural foraminal stenosis L1-L2: No significant spinal canal or neural foraminal stenosis L2-L3: No significant spinal canal or neural foraminal stenosis L3-L4: There desiccation and narrowing with is a shallow disc protrusion and mild left worse than right facet arthropathy resulting in mild left subarticular zone narrowing without frank nerve root impingement and no significant neural foraminal stenosis. Findings are overall similar to the prior study. L4-L5: There is disc desiccation and narrowing with a diffuse bulge and mild bilateral neural foraminal stenosis resulting in moderate right and moderate to severe left neural foraminal stenosis with possible impingement of the exiting left L4 nerve root. Findings are worsened since 2020. L5-S1: There is mild disc desiccation and narrowing with a mild disc bulge and mild bilateral facet arthropathy resulting in moderate to severe bilateral neural foraminal stenosis, slightly progressed since the prior study. IMPRESSION: 1. New diffuse thickening of the cauda equina nerve roots is nonspecific and could reflect inflammatory etiology (including acute or chronic inflammatory demyelinating polyneuropathy), neoplasm/leptomeningeal disease, or infection. Correlate with history and recommend postcontrast imaging for further evaluation. Lumbar puncture could also be considered for further evaluation. 2. Moderate to severe left and  moderate right neural foraminal stenosis at L4-L5 and moderate to severe bilateral neural foraminal stenosis at L5-S1 has slightly progressed since 2020. These results will be called to the ordering clinician or representative by the Radiologist Assistant, and communication documented in the PACS or [employer]. Electronically Signed   By: Lesia Hausen M.D.   On: 01/01/2022 11:28    Assessment & Plan:  DDD (degenerative disc disease), lumbar -     Basic metabolic panel; Future  Gastroesophageal reflux disease with esophagitis without hemorrhage -     Basic metabolic panel; Future -     CBC with Differential/Platelet; Future  Pure hypercholesterolemia -     Lipid panel; Future -     Lipoprotein A (LPA); Future -     TSH; Future -     Hepatic function  panel; Future  Routine general medical examination at a health care facility -     PSA; Future  Bradycardia -     Basic metabolic panel; Future -     TSH; Future -     EKG 12-Lead  Bilateral hearing loss due to cerumen impaction -     Ear Lavage     Follow-up: Return in about 6 months (around 07/02/2023).  Sanda Linger, MD

## 2022-12-30 NOTE — Progress Notes (Unsigned)
PRE-PROCEDURE EXAM:  Bilateral TM cannot be visualized due to total occlusion/impaction of the ear canal.  PROCEDURE INDICATION: remove wax to visualize ear drum & relieve discomfort  CONSENT:  Verbal     PROCEDURE NOTE:     bilateral EAR:  I used warm water irrigation under direct visualization with the otoscope to free the wax bolus from the ear canal.     POST- PROCEDURE EXAM:  bilateral TMs successfully visualized and found to have no erythema     The patient tolerated the procedure well.

## 2023-01-03 ENCOUNTER — Other Ambulatory Visit: Payer: Self-pay | Admitting: Internal Medicine

## 2023-01-03 DIAGNOSIS — E7841 Elevated Lipoprotein(a): Secondary | ICD-10-CM | POA: Insufficient documentation

## 2023-01-03 NOTE — Addendum Note (Signed)
Addended by: Etta Grandchild on: 01/03/2023 09:50 AM   Modules accepted: Orders

## 2023-01-15 ENCOUNTER — Ambulatory Visit: Admission: RE | Admit: 2023-01-15 | Payer: BC Managed Care – PPO | Source: Ambulatory Visit

## 2023-01-15 ENCOUNTER — Other Ambulatory Visit: Payer: BC Managed Care – PPO

## 2023-01-15 DIAGNOSIS — E7841 Elevated Lipoprotein(a): Secondary | ICD-10-CM

## 2023-01-15 DIAGNOSIS — E7800 Pure hypercholesterolemia, unspecified: Secondary | ICD-10-CM

## 2023-01-27 ENCOUNTER — Other Ambulatory Visit: Payer: BC Managed Care – PPO

## 2023-11-11 ENCOUNTER — Encounter: Payer: Self-pay | Admitting: Gastroenterology

## 2023-12-19 ENCOUNTER — Ambulatory Visit (AMBULATORY_SURGERY_CENTER): Admitting: *Deleted

## 2023-12-19 ENCOUNTER — Encounter: Payer: Self-pay | Admitting: Gastroenterology

## 2023-12-19 VITALS — Ht 69.0 in | Wt 240.0 lb

## 2023-12-19 DIAGNOSIS — Z860100 Personal history of colon polyps, unspecified: Secondary | ICD-10-CM

## 2023-12-19 MED ORDER — PEG 3350-KCL-NA BICARB-NACL 420 G PO SOLR: 4000.0000 mL | Freq: Once | ORAL | 0 refills | Status: AC

## 2023-12-19 NOTE — Progress Notes (Signed)
 Pt's name and DOB verified at the beginning of the pre-visit wit 2 identifiers  Pt denies any difficulty with ambulating,sitting, laying down or rolling side to side  Pt has no issues moving head neck or swallowing  No egg or soy allergy known to patient   No issues known to pt with past sedation with any surgeries or procedures  No FH of Malignant Hyperthermia  Pt is not on home 02   Pt is not on blood thinners   Pt denies issues with constipation   Pt is not on dialysis  Pt denise any abnormal heart rhythms   Pt denies any upcoming cardiac testing  Patient's chart reviewed by Norleen Schillings CNRA prior to pre-visit and patient appropriate for the [facility].  Pre-visit completed and red dot placed by patient's name on their procedure day (on provider's schedule).    Visit by phone  Pt states weight is 240 lb  IInstructions reviewed. Pt given  both [facility] main # and MD on call # prior to instructions.  Pt states understanding of instructions. Instructed pt to review instructions again prior to procedure and call main # given if has questions.. Pt states they will.   Instructed pt on where to find instructions on My Chart.

## 2024-01-02 ENCOUNTER — Encounter: Payer: Self-pay | Admitting: Gastroenterology

## 2024-01-02 ENCOUNTER — Ambulatory Visit: Admitting: Gastroenterology

## 2024-01-02 VITALS — BP 114/57 | HR 56 | Temp 97.0°F | Resp 12 | Ht 69.0 in | Wt 240.0 lb

## 2024-01-02 DIAGNOSIS — Z1211 Encounter for screening for malignant neoplasm of colon: Secondary | ICD-10-CM | POA: Diagnosis present

## 2024-01-02 DIAGNOSIS — L818 Other specified disorders of pigmentation: Secondary | ICD-10-CM | POA: Diagnosis not present

## 2024-01-02 DIAGNOSIS — K641 Second degree hemorrhoids: Secondary | ICD-10-CM

## 2024-01-02 DIAGNOSIS — Z860101 Personal history of adenomatous and serrated colon polyps: Secondary | ICD-10-CM | POA: Diagnosis not present

## 2024-01-02 DIAGNOSIS — Z860100 Personal history of colon polyps, unspecified: Secondary | ICD-10-CM

## 2024-01-02 DIAGNOSIS — K5730 Diverticulosis of large intestine without perforation or abscess without bleeding: Secondary | ICD-10-CM

## 2024-01-02 MED ORDER — SODIUM CHLORIDE 0.9 % IV SOLN: 500.0000 mL | Freq: Once | INTRAVENOUS | Status: DC

## 2024-01-02 NOTE — Patient Instructions (Signed)
 YOU HAD AN ENDOSCOPIC PROCEDURE TODAY AT THE [facility]:   Refer to the procedure report that was given to you for any specific questions about what was found during the examination.  If the procedure report does not answer your questions, please call your gastroenterologist to clarify.  If you requested that your care partner not be given the details of your procedure findings, then the procedure report has been included in a sealed envelope for you to review at your convenience later.  YOU SHOULD EXPECT: Some feelings of bloating in the abdomen. Passage of more gas than usual.  Walking can help get rid of the air that was put into your GI tract during the procedure and reduce the bloating. If you had a lower endoscopy (such as a colonoscopy or flexible sigmoidoscopy) you may notice spotting of blood in your stool or on the toilet paper. If you underwent a bowel prep for your procedure, you may not have a normal bowel movement for a few days.  Please Note:  You might notice some irritation and congestion in your nose or some drainage.  This is from the oxygen used during your procedure.  There is no need for concern and it should clear up in a day or so.  SYMPTOMS TO REPORT IMMEDIATELY:  Following lower endoscopy (colonoscopy or flexible sigmoidoscopy):  Excessive amounts of blood in the stool  Significant tenderness or worsening of abdominal pains  Swelling of the abdomen that is new, acute  Fever of 100F or higher  For urgent or emergent issues, a gastroenterologist can be reached at any hour by calling [phone]. Do not use MyChart messaging for urgent concerns.    DIET:  We do recommend a small meal at first, but then you may proceed to your regular diet.  Drink plenty of fluids but you should avoid alcoholic beverages for 24 hours. Follow a High Fiber Diet.  MEDICATIONS: Continue present medications. Use FiberCon 1-2 tablets by mouth daily.  FOLLOW UP: Repeat  colonoscopy in 5 years for surveillance due to history of previous advanced adenoma resection.  Thank you for allowing us  to provide for your healthcare needs today.  ACTIVITY:  You should plan to take it easy for the rest of today and you should NOT DRIVE or use heavy machinery until tomorrow (because of the sedation medicines used during the test).    FOLLOW UP: Our staff will call the number listed on your records the next business day following your procedure.  We will call around 7:15- 8:00 am to check on you and address any questions or concerns that you may have regarding the information given to you following your procedure. If we do not reach you, we will leave a message.     If any biopsies were taken you will be contacted by phone or by letter within the next 1-3 weeks.  Please call us  at [phone] if you have not heard about the biopsies in 3 weeks.    SIGNATURES/CONFIDENTIALITY: You and/or your care partner have signed paperwork which will be entered into your electronic medical record.  These signatures attest to the fact that that the information above on your After Visit Summary has been reviewed and is understood.  Full responsibility of the confidentiality of this discharge information lies with you and/or your care-partner.

## 2024-01-02 NOTE — Progress Notes (Signed)
 GASTROENTEROLOGY PROCEDURE H&P NOTE   Primary Care Physician: Joshua Debby CROME, MD  HPI: Christopher Byrd is a 53 y.o. male who presents for colonoscopy for surveillance in setting of previous advanced adenoma.  Past Medical History:  Diagnosis Date   Allergy    Arthritis    knee back   GERD (gastroesophageal reflux disease)    Hyperlipidemia    PTSD (post-traumatic stress disorder)    PTSD (post-traumatic stress disorder)    Sleep apnea    cpap usuage   Past Surgical History:  Procedure Laterality Date   APPENDECTOMY  2002   COLONOSCOPY     COLONOSCOPY WITH PROPOFOL  N/A 11/23/2018   Procedure: COLONOSCOPY WITH PROPOFOL ;  Surgeon: Mansouraty, Aloha Raddle., MD;  Location: [facility] ENDOSCOPY;  Service: Gastroenterology;  Laterality: N/A;   cyst removal from rectum     ENDOSCOPIC MUCOSAL RESECTION N/A 11/23/2018   Procedure: ENDOSCOPIC MUCOSAL RESECTION;  Surgeon: Wilhelmenia Aloha Raddle., MD;  Location: [facility] ENDOSCOPY;  Service: Gastroenterology;  Laterality: N/A;  endo loop   HAND SURGERY Left    fracture   HEMOSTASIS CLIP PLACEMENT  11/23/2018   Procedure: HEMOSTASIS CLIP PLACEMENT;  Surgeon: Wilhelmenia Aloha Raddle., MD;  Location: [facility] ENDOSCOPY;  Service: Gastroenterology;;   HEMOSTASIS CONTROL  11/23/2018   Procedure: HEMOSTASIS CONTROL;  Surgeon: Wilhelmenia Aloha Raddle., MD;  Location: [facility] ENDOSCOPY;  Service: Gastroenterology;;   HOT HEMOSTASIS N/A 11/23/2018   Procedure: HOT HEMOSTASIS (ARGON PLASMA COAGULATION/BICAP);  Surgeon: Wilhelmenia Aloha Raddle., MD;  Location: [facility] ENDOSCOPY;  Service: Gastroenterology;  Laterality: N/A;   POLYPECTOMY     SHOULDER SURGERY Right 09/2020   Current Outpatient Medications  Medication Sig Dispense Refill   atorvastatin  (LIPITOR) 40 MG tablet TAKE 1 TABLET BY MOUTH EVERY DAY 90 tablet 1   baclofen (LIORESAL) 10 MG tablet Take 10 mg by mouth 2 (two) times daily as needed.      buPROPion (WELLBUTRIN SR) 150 MG 12 hr tablet Take 150 mg by mouth 2  (two) times daily.  5   clonazePAM (KLONOPIN) 2 MG tablet Take by mouth.     esomeprazole  (NEXIUM ) 40 MG capsule TAKE 1 CAPSULE (40 MG TOTAL) BY MOUTH DAILY AT 12 NOON. 90 capsule 1   fluticasone (FLONASE) 50 MCG/ACT nasal spray Place 1 spray into both nostrils daily.  (Patient taking differently: Place 1 spray into both nostrils as needed.)     hydrOXYzine (ATARAX/VISTARIL) 25 MG tablet Take 100 mg by mouth at bedtime.     loratadine (CLARITIN) 10 MG tablet Take 10 mg by mouth daily.     Lurasidone HCl (LATUDA) 120 MG TABS Latuda 120 mg tablet  TAKE 1 TABLET BY MOUTH EVERY DAY WITH SUPPER     naproxen (NAPROSYN) 500 MG tablet Take 500 mg by mouth 2 (two) times daily as needed.     traZODone (DESYREL) 50 MG tablet Take 200 mg by mouth at bedtime.  1   No current facility-administered medications for this visit.    Current Outpatient Medications:    atorvastatin  (LIPITOR) 40 MG tablet, TAKE 1 TABLET BY MOUTH EVERY DAY, Disp: 90 tablet, Rfl: 1   baclofen (LIORESAL) 10 MG tablet, Take 10 mg by mouth 2 (two) times daily as needed. , Disp: , Rfl:    buPROPion (WELLBUTRIN SR) 150 MG 12 hr tablet, Take 150 mg by mouth 2 (two) times daily., Disp: , Rfl: 5   clonazePAM (KLONOPIN) 2 MG tablet, Take by mouth., Disp: , Rfl:    esomeprazole  (NEXIUM )  40 MG capsule, TAKE 1 CAPSULE (40 MG TOTAL) BY MOUTH DAILY AT 12 NOON., Disp: 90 capsule, Rfl: 1   fluticasone (FLONASE) 50 MCG/ACT nasal spray, Place 1 spray into both nostrils daily.  (Patient taking differently: Place 1 spray into both nostrils as needed.), Disp: , Rfl:    hydrOXYzine (ATARAX/VISTARIL) 25 MG tablet, Take 100 mg by mouth at bedtime., Disp: , Rfl:    loratadine (CLARITIN) 10 MG tablet, Take 10 mg by mouth daily., Disp: , Rfl:    Lurasidone HCl (LATUDA) 120 MG TABS, Latuda 120 mg tablet  TAKE 1 TABLET BY MOUTH EVERY DAY WITH SUPPER, Disp: , Rfl:    naproxen (NAPROSYN) 500 MG tablet, Take 500 mg by mouth 2 (two) times daily as needed., Disp: ,  Rfl:    traZODone (DESYREL) 50 MG tablet, Take 200 mg by mouth at bedtime., Disp: , Rfl: 1 Allergies  Allergen Reactions   Simvastatin     Muscle aches   Family History  Problem Relation Age of Onset   Breast cancer Mother    Hypertension Father    Diabetes Father    Prostate cancer Father    Crohn's disease Father    Sickle cell anemia Brother    Diabetes Other    Hypertension Other    Cancer Other        Prostate and Breast Cancer   Alcohol abuse Neg Hx    Drug abuse Neg Hx    Heart disease Neg Hx    Hyperlipidemia Neg Hx    Stroke Neg Hx    Colon cancer Neg Hx    Colon polyps Neg Hx    Rectal cancer Neg Hx    Stomach cancer Neg Hx    Inflammatory bowel disease Neg Hx    Liver disease Neg Hx    Pancreatic cancer Neg Hx    Social History   Socioeconomic History   Marital status: Married    Spouse name: Not on file   Number of children: 2   Years of education: Not on file   Highest education level: Not on file  Occupational History   Not on file  Tobacco Use   Smoking status: Never   Smokeless tobacco: Never  Vaping Use   Vaping status: Never Used  Substance and Sexual Activity   Alcohol use: Yes    Alcohol/week: 7.0 standard drinks of alcohol    Types: 7 Glasses of wine per week   Drug use: No   Sexual activity: Yes    Partners: Female  Other Topics Concern   Not on file  Social History Narrative   Caffienated drinks-no   Seat belt use often-yes   Regular Exercise-yes   Smoke alarm in the home-yes   Firearms/guns in the home-yes   History of physical abuse-no               Social Drivers of Corporate investment banker Strain: Not on file  Food Insecurity: Not on file  Transportation Needs: Not on file  Physical Activity: Not on file  Stress: Not on file  Social Connections: Unknown (10/23/2021)   Received from [facility]   Social Network    Social Network: Not on file  Intimate Partner Violence: Unknown (09/14/2021)   Received from  [facility]   HITS    Physically Hurt: Not on file    Insult or Talk Down To: Not on file    Threaten Physical Harm: Not on file  Scream or Curse: Not on file    Physical Exam: There were no vitals filed for this visit. There is no height or weight on file to calculate BMI. GEN: NAD EYE: Sclerae anicteric ENT: MMM CV: Non-tachycardic GI: Soft, NT/ND NEURO:  Alert & Oriented x 3  Lab Results: No results for input(s): WBC, HGB, HCT, PLT in the last 72 hours. BMET No results for input(s): NA, K, CL, CO2, GLUCOSE, BUN, CREATININE, CALCIUM  in the last 72 hours. LFT No results for input(s): PROT, ALBUMIN, AST, ALT, ALKPHOS, BILITOT, BILIDIR, IBILI in the last 72 hours. PT/INR No results for input(s): LABPROT, INR in the last 72 hours.   Impression / Plan: This is a 53 y.o.male who presents for colonoscopy for surveillance in setting of previous advanced adenomas.  The risks and benefits of endoscopic evaluation/treatment were discussed with the patient and/or family; these include but are not limited to the risk of perforation, infection, bleeding, missed lesions, lack of diagnosis, severe illness requiring hospitalization, as well as anesthesia and sedation related illnesses.  The patient's history has been reviewed, patient examined, no change in status, and deemed stable for procedure.  The patient and/or family is agreeable to proceed.    Aloha Finner, MD [facility]oscopy Office # [phone]

## 2024-01-02 NOTE — Op Note (Signed)
 [facility] Patient Name: Christopher Byrd Procedure Date: 01/02/2024 9:26 AM MRN: [id number] Endoscopist: Aloha Finner , MD, [id number] Age: 53 Referring MD:  Date of Birth: 1970/09/22 Gender: Male Account #: [id number] Procedure:                Colonoscopy Indications:              High risk colon cancer surveillance: Personal                            history of adenoma (10 mm or greater in size), High                            risk colon cancer surveillance: Personal history of                            adenoma with villous component Medicines:                Monitored Anesthesia Care Procedure:                Pre-Anesthesia Assessment:                           - Prior to the procedure, a History and Physical                            was performed, and patient medications and                            allergies were reviewed. The patient's tolerance of                            previous anesthesia was also reviewed. The risks                            and benefits of the procedure and the sedation                            options and risks were discussed with the patient.                            All questions were answered, and informed consent                            was obtained. Prior Anticoagulants: The patient has                            taken no anticoagulant or antiplatelet agents. ASA                            Grade Assessment: II - A patient with mild systemic                            disease. After reviewing the risks and benefits,  the patient was deemed in satisfactory condition to                            undergo the procedure.                           After obtaining informed consent, the colonoscope                            was passed under direct vision. Throughout the                            procedure, the patient's blood pressure, pulse, and                            oxygen saturations were  monitored continuously. The                            Olympus Scope SN [id number] was introduced through the                            anus and advanced to the 3 cm into the ileum. The                            colonoscopy was performed without difficulty. The                            patient tolerated the procedure. The quality of the                            bowel preparation was good. The terminal ileum,                            ileocecal valve, appendiceal orifice, and rectum                            were photographed. Scope In: 9:34:42 AM Scope Out: 9:46:58 AM Scope Withdrawal Time: 0 hours 8 minutes 33 seconds  Total Procedure Duration: 0 hours 12 minutes 16 seconds  Findings:                 The digital rectal exam findings include                            hemorrhoids. Pertinent negatives include no                            palpable rectal lesions.                           A tattoo was seen in the descending colon. A                            post-polypectomy scar was found at the tattoo site.  Multiple small-mouthed diverticula were found in                            the recto-sigmoid colon and sigmoid colon.                           Normal mucosa was found in the entire colon                            otherwise.                           Non-bleeding non-thrombosed internal hemorrhoids                            were found during retroflexion, during perianal                            exam and during digital exam. The hemorrhoids were                            Grade II (internal hemorrhoids that prolapse but                            reduce spontaneously). Complications:            No immediate complications. Estimated Blood Loss:     Estimated blood loss: none. Impression:               - Hemorrhoids found on digital rectal exam.                           - A tattoo was seen in the descending colon. A                             post-polypectomy scar was found at the tattoo site.                           - Diverticulosis in the recto-sigmoid colon and in                            the sigmoid colon.                           - Normal mucosa in the entire examined colon                            otherwise.                           - Non-bleeding non-thrombosed internal hemorrhoids. Recommendation:           - The patient will be observed post-procedure,                            until all discharge criteria are met.                           -  Discharge patient to home.                           - Patient has a contact number available for                            emergencies. The signs and symptoms of potential                            delayed complications were discussed with the                            patient. Return to normal activities tomorrow.                            Written discharge instructions were provided to the                            patient.                           - High fiber diet.                           - Use FiberCon 1-2 tablets PO daily.                           - Continue present medications.                           - Repeat colonoscopy in 5 years for surveillance                            due to history of previous advanced adenoma                            resection.                           - The findings and recommendations were discussed                            with the patient.                           - The findings and recommendations were discussed                            with the patient's family. Aloha Finner, MD 01/02/2024 9:51:44 AM

## 2024-01-02 NOTE — Progress Notes (Signed)
 To pacu, VSS. Report to RN.tb

## 2024-01-02 NOTE — Progress Notes (Signed)
 Pt's states no medical or surgical changes since previsit or office visit.

## 2024-01-05 ENCOUNTER — Telehealth: Payer: Self-pay | Admitting: *Deleted

## 2024-01-05 NOTE — Telephone Encounter (Signed)
  Follow up Call-     01/02/2024    8:45 AM  Call back number  Post procedure Call Back phone  # [phone]  Permission to leave phone message Yes     Patient questions:  Do you have a fever, pain , or abdominal swelling? No. Pain Score  0 *  Have you tolerated food without any problems? Yes.    Have you been able to return to your normal activities? Yes.    Do you have any questions about your discharge instructions: Diet   No. Medications  No. Follow up visit  No.  Do you have questions or concerns about your Care? No.  Actions: * If pain score is 4 or above: No action needed, pain <4.

## 2024-01-07 ENCOUNTER — Ambulatory Visit: Payer: Self-pay | Admitting: Internal Medicine

## 2024-01-07 ENCOUNTER — Ambulatory Visit (INDEPENDENT_AMBULATORY_CARE_PROVIDER_SITE_OTHER): Admitting: Internal Medicine

## 2024-01-07 ENCOUNTER — Encounter: Payer: Self-pay | Admitting: Internal Medicine

## 2024-01-07 VITALS — BP 130/76 | HR 50 | Temp 98.4°F | Resp 16 | Ht 69.0 in | Wt 246.6 lb

## 2024-01-07 DIAGNOSIS — K2100 Gastro-esophageal reflux disease with esophagitis, without bleeding: Secondary | ICD-10-CM | POA: Diagnosis not present

## 2024-01-07 DIAGNOSIS — R748 Abnormal levels of other serum enzymes: Secondary | ICD-10-CM | POA: Insufficient documentation

## 2024-01-07 DIAGNOSIS — E7800 Pure hypercholesterolemia, unspecified: Secondary | ICD-10-CM | POA: Diagnosis not present

## 2024-01-07 DIAGNOSIS — Z0000 Encounter for general adult medical examination without abnormal findings: Secondary | ICD-10-CM | POA: Diagnosis not present

## 2024-01-07 DIAGNOSIS — Z23 Encounter for immunization: Secondary | ICD-10-CM

## 2024-01-07 DIAGNOSIS — Z125 Encounter for screening for malignant neoplasm of prostate: Secondary | ICD-10-CM

## 2024-01-07 DIAGNOSIS — E7841 Elevated Lipoprotein(a): Secondary | ICD-10-CM | POA: Diagnosis not present

## 2024-01-07 DIAGNOSIS — R001 Bradycardia, unspecified: Secondary | ICD-10-CM

## 2024-01-07 LAB — TSH: TSH: 1.07 u[IU]/mL (ref 0.35–5.50)

## 2024-01-07 LAB — CBC WITH DIFFERENTIAL/PLATELET
Basophils Absolute: 0 K/uL (ref 0.0–0.1)
Basophils Relative: 0.5 % (ref 0.0–3.0)
Eosinophils Absolute: 0.1 K/uL (ref 0.0–0.7)
Eosinophils Relative: 0.9 % (ref 0.0–5.0)
HCT: 43.4 % (ref 39.0–52.0)
Hemoglobin: 14.8 g/dL (ref 13.0–17.0)
Lymphocytes Relative: 19.5 % (ref 12.0–46.0)
Lymphs Abs: 1.2 K/uL (ref 0.7–4.0)
MCHC: 34.1 g/dL (ref 30.0–36.0)
MCV: 88.3 fl (ref 78.0–100.0)
Monocytes Absolute: 0.5 K/uL (ref 0.1–1.0)
Monocytes Relative: 8.8 % (ref 3.0–12.0)
Neutro Abs: 4.2 K/uL (ref 1.4–7.7)
Neutrophils Relative %: 70.3 % (ref 43.0–77.0)
Platelets: 195 K/uL (ref 150.0–400.0)
RBC: 4.92 Mil/uL (ref 4.22–5.81)
RDW: 13.6 % (ref 11.5–15.5)
WBC: 5.9 K/uL (ref 4.0–10.5)

## 2024-01-07 LAB — BASIC METABOLIC PANEL WITH GFR
BUN: 19 mg/dL (ref 6–23)
CO2: 29 meq/L (ref 19–32)
Calcium: 9.2 mg/dL (ref 8.4–10.5)
Chloride: 103 meq/L (ref 96–112)
Creatinine, Ser: 1.18 mg/dL (ref 0.40–1.50)
GFR: 69.77 mL/min (ref >60.00)
Glucose, Bld: 95 mg/dL (ref 70–99)
Potassium: 4.4 meq/L (ref 3.5–5.1)
Sodium: 139 meq/L (ref 135–145)

## 2024-01-07 LAB — LIPID PANEL
Cholesterol: 146 mg/dL (ref 0–200)
HDL: 47.1 mg/dL (ref >39.00)
LDL Cholesterol: 81 mg/dL (ref 0–99)
NonHDL: 98.76
Total CHOL/HDL Ratio: 3
Triglycerides: 88 mg/dL (ref 0.0–149.0)
VLDL: 17.6 mg/dL (ref 0.0–40.0)

## 2024-01-07 LAB — HEPATIC FUNCTION PANEL
ALT: 31 U/L (ref 0–53)
AST: 26 U/L (ref 0–37)
Albumin: 4.4 g/dL (ref 3.5–5.2)
Alkaline Phosphatase: 89 U/L (ref 39–117)
Bilirubin, Direct: 0.2 mg/dL (ref 0.0–0.3)
Total Bilirubin: 1.3 mg/dL — ABNORMAL HIGH (ref 0.2–1.2)
Total Protein: 6.6 g/dL (ref 6.0–8.3)

## 2024-01-07 LAB — CK: Total CK: 438 U/L — ABNORMAL HIGH (ref 17–232)

## 2024-01-07 LAB — PSA: PSA: 0.45 ng/mL (ref 0.10–4.00)

## 2024-01-07 NOTE — Patient Instructions (Signed)
 You received your first Hepatitis B vaccine today. Please return for the second dose in one month.    Health Maintenance, Male Adopting a healthy lifestyle and getting preventive care are important in promoting health and wellness. Ask your health care provider about: The right schedule for you to have regular tests and exams. Things you can do on your own to prevent diseases and keep yourself healthy. What should I know about diet, weight, and exercise? Eat a healthy diet  Eat a diet that includes plenty of vegetables, fruits, low-fat dairy products, and lean protein. Do not eat a lot of foods that are high in solid fats, added sugars, or sodium. Maintain a healthy weight Body mass index (BMI) is a measurement that can be used to identify possible weight problems. It estimates body fat based on height and weight. Your health care provider can help determine your BMI and help you achieve or maintain a healthy weight. Get regular exercise Get regular exercise. This is one of the most important things you can do for your health. Most adults should: Exercise for at least 150 minutes each week. The exercise should increase your heart rate and make you sweat (moderate-intensity exercise). Do strengthening exercises at least twice a week. This is in addition to the moderate-intensity exercise. Spend less time sitting. Even light physical activity can be beneficial. Watch cholesterol and blood lipids Have your blood tested for lipids and cholesterol at 55 years of age, then have this test every 5 years. You may need to have your cholesterol levels checked more often if: Your lipid or cholesterol levels are high. You are older than 55 years of age. You are at high risk for heart disease. What should I know about cancer screening? Many types of cancers can be detected early and may often be prevented. Depending on your health history and family history, you may need to have cancer screening at  various ages. This may include screening for: Colorectal cancer. Prostate cancer. Skin cancer. Lung cancer. What should I know about heart disease, diabetes, and high blood pressure? Blood pressure and heart disease High blood pressure causes heart disease and increases the risk of stroke. This is more likely to develop in people who have high blood pressure readings or are overweight. Talk with your health care provider about your target blood pressure readings. Have your blood pressure checked: Every 3-5 years if you are 55-60 years of age. Every year if you are 8 years old or older. If you are between the ages of 76 and 58 and are a current or former smoker, ask your health care provider if you should have a one-time screening for abdominal aortic aneurysm (AAA). Diabetes Have regular diabetes screenings. This checks your fasting blood sugar level. Have the screening done: Once every three years after age 76 if you are at a normal weight and have a low risk for diabetes. More often and at a younger age if you are overweight or have a high risk for diabetes. What should I know about preventing infection? Hepatitis B If you have a higher risk for hepatitis B, you should be screened for this virus. Talk with your health care provider to find out if you are at risk for hepatitis B infection. Hepatitis C Blood testing is recommended for: Everyone born from [birth year] through 1967. Anyone with known risk factors for hepatitis C. Sexually transmitted infections (STIs) You should be screened each year for STIs, including gonorrhea and chlamydia, if: You are  sexually active and are younger than 55 years of age. You are older than 55 years of age and your health care provider tells you that you are at risk for this type of infection. Your sexual activity has changed since you were last screened, and you are at increased risk for chlamydia or gonorrhea. Ask your health care provider if you are at  risk. Ask your health care provider about whether you are at high risk for HIV. Your health care provider may recommend a prescription medicine to help prevent HIV infection. If you choose to take medicine to prevent HIV, you should first get tested for HIV. You should then be tested every 3 months for as long as you are taking the medicine. Follow these instructions at home: Alcohol use Do not drink alcohol if your health care provider tells you not to drink. If you drink alcohol: Limit how much you have to 0-2 drinks a day. Know how much alcohol is in your drink. In the [place] one drink equals one 12 oz bottle of beer (355 mL), one 5 oz glass of wine (148 mL), or one 1 oz glass of hard liquor (44 mL). Lifestyle Do not use any products that contain nicotine or tobacco. These products include cigarettes, chewing tobacco, and vaping devices, such as e-cigarettes. If you need help quitting, ask your health care provider. Do not use street drugs. Do not share needles. Ask your health care provider for help if you need support or information about quitting drugs. General instructions Schedule regular health, dental, and eye exams. Stay current with your vaccines. Tell your health care provider if: You often feel depressed. You have ever been abused or do not feel safe at home. Summary Adopting a healthy lifestyle and getting preventive care are important in promoting health and wellness. Follow your health care provider's instructions about healthy diet, exercising, and getting tested or screened for diseases. Follow your health care provider's instructions on monitoring your cholesterol and blood pressure. This information is not intended to replace advice given to you by your health care provider. Make sure you discuss any questions you have with your health care provider. Document Revised: 10/16/2020 Document Reviewed: 10/16/2020 Elsevier Patient Education  2024 [employer].

## 2024-01-07 NOTE — Progress Notes (Signed)
 Subjective:  Patient ID: Christopher Byrd, male    DOB: 1971/05/29  Age: 53 y.o. MRN: [id number]  CC: Annual Exam, Hyperlipidemia, and Gastroesophageal Reflux   HPI Christopher Byrd presents for a CPX and f/up ---  Discussed the use of AI scribe software for clinical note transcription with the patient, who gave verbal consent to proceed.  History of Present Illness Christopher Byrd is a 53 year old male who presents for a follow-up after a recent colonoscopy.  He recently underwent a colonoscopy and reports that he was told there were no polyps or lesions. He had a large polyp in the past, but reports that during the recent procedure he was told everything looks good.  He is currently taking several medications including omeprazole for acid reflux, Latuda, hydroxyzine, Klonopin, naproxen for pain, baclofen as needed for muscle spasms, Lipitor for cholesterol, Wellbutrin, trazodone, Claritin as needed, and Flonase as needed.  He experiences muscle aches and joint aches, which he attributes to the statin medication, but describes them as 'not real severe'.  He engages in physical activity by riding his bike and feels 'pretty good' during this activity without experiencing chest pain, shortness of breath, dizziness, or lightheadedness.  No symptoms of a low heart rate, such as dizziness or lightheadedness.    Outpatient Medications Prior to Visit  Medication Sig Dispense Refill   baclofen (LIORESAL) 10 MG tablet Take 10 mg by mouth 2 (two) times daily as needed.      buPROPion (WELLBUTRIN SR) 150 MG 12 hr tablet Take 150 mg by mouth 2 (two) times daily.  5   clonazePAM (KLONOPIN) 2 MG tablet Take by mouth.     fluticasone (FLONASE) 50 MCG/ACT nasal spray Place 1 spray into both nostrils daily.  (Patient taking differently: Place 1 spray into both nostrils daily as needed for allergies or rhinitis.)     hydrOXYzine (ATARAX/VISTARIL) 25 MG tablet Take 100 mg by mouth at bedtime.      loratadine (CLARITIN) 10 MG tablet Take 10 mg by mouth daily. (Patient taking differently: Take 10 mg by mouth daily as needed for allergies, rhinitis or itching.)     Lurasidone HCl (LATUDA) 120 MG TABS Latuda 120 mg tablet  TAKE 1 TABLET BY MOUTH EVERY DAY WITH SUPPER     naproxen (NAPROSYN) 500 MG tablet Take 500 mg by mouth 2 (two) times daily as needed.     OMEPRAZOLE PO Take by mouth.     traZODone (DESYREL) 50 MG tablet Take 200 mg by mouth at bedtime.  1   atorvastatin  (LIPITOR) 40 MG tablet TAKE 1 TABLET BY MOUTH EVERY DAY 90 tablet 1   esomeprazole  (NEXIUM ) 40 MG capsule TAKE 1 CAPSULE (40 MG TOTAL) BY MOUTH DAILY AT 12 NOON. (Patient not taking: Reported on 01/07/2024) 90 capsule 1   0.9 %  sodium chloride  infusion      No facility-administered medications prior to visit.    ROS Review of Systems  Constitutional:  Negative for appetite change, chills, diaphoresis, fatigue and fever.  HENT: Negative.    Respiratory: Negative.  Negative for chest tightness, shortness of breath and wheezing.   Cardiovascular:  Negative for chest pain, palpitations and leg swelling.  Gastrointestinal:  Negative for abdominal pain, constipation, nausea and vomiting.  Genitourinary: Negative.  Negative for difficulty urinating.  Musculoskeletal:  Positive for arthralgias, back pain and myalgias.  Skin: Negative.   Neurological:  Negative for dizziness, weakness and light-headedness.  Hematological:  Negative for  adenopathy. Does not bruise/bleed easily.  Psychiatric/Behavioral: Negative.      Objective:  BP 130/76 (BP Location: Left Arm, Patient Position: Sitting, Cuff Size: Normal)   Pulse (!) 50   Temp 98.4 F (36.9 C) (Oral)   Resp 16   Ht 5' 9 (1.753 m)   Wt 246 lb 9.6 oz (111.9 kg)   SpO2 96%   BMI 36.42 kg/m   BP Readings from Last 3 Encounters:  01/07/24 130/76  01/02/24 (!) 114/57  12/30/22 116/76    Wt Readings from Last 3 Encounters:  01/07/24 246 lb 9.6 oz (111.9 kg)   01/02/24 240 lb (108.9 kg)  12/19/23 240 lb (108.9 kg)    Physical Exam Vitals reviewed.  Constitutional:      Appearance: Normal appearance.  HENT:     Mouth/Throat:     Mouth: Mucous membranes are moist.  Eyes:     General: No scleral icterus.    Conjunctiva/sclera: Conjunctivae normal.  Cardiovascular:     Rate and Rhythm: Bradycardia present.     Heart sounds: Normal heart sounds, S1 normal and S2 normal. No murmur heard.    No friction rub. No gallop.     Comments: EKG-- SB, 50 bpm No LVH, Q waves, or ST/T wave changes  Unchanged  Pulmonary:     Effort: Pulmonary effort is normal.     Breath sounds: No stridor. No wheezing, rhonchi or rales.  Abdominal:     General: Abdomen is protuberant. Bowel sounds are normal. There is no distension.     Palpations: There is no hepatomegaly, splenomegaly or mass.     Tenderness: There is no abdominal tenderness. There is no guarding.     Hernia: No hernia is present.  Musculoskeletal:     Cervical back: Neck supple.     Right lower leg: No edema.     Left lower leg: No edema.  Lymphadenopathy:     Cervical: No cervical adenopathy.  Skin:    General: Skin is warm and dry.  Neurological:     General: No focal deficit present.     Mental Status: He is alert. Mental status is at baseline.     Lab Results  Component Value Date   WBC 5.9 01/07/2024   HGB 14.8 01/07/2024   HCT 43.4 01/07/2024   PLT 195.0 01/07/2024   GLUCOSE 95 01/07/2024   CHOL 146 01/07/2024   TRIG 88.0 01/07/2024   HDL 47.10 01/07/2024   LDLCALC 81 01/07/2024   ALT 31 01/07/2024   AST 26 01/07/2024   NA 139 01/07/2024   K 4.4 01/07/2024   CL 103 01/07/2024   CREATININE 1.18 01/07/2024   BUN 19 01/07/2024   CO2 29 01/07/2024   TSH 1.07 01/07/2024   PSA 0.45 01/07/2024   INR 1.1 (H) 10/19/2018    CT CARDIAC SCORING ([facility] LOCATIONS ONLY) Result Date: 01/15/2023 CLINICAL DATA:  53 year old African American male with history of  hypercholesterolemia. * Tracking Code: FCC * EXAM: CT CARDIAC CORONARY ARTERY CALCIUM  SCORE TECHNIQUE: Non-contrast imaging through the heart was performed using prospective ECG gating. Image post processing was performed on an independent workstation, allowing for quantitative analysis of the heart and coronary arteries. Note that this exam targets the heart and the chest was not imaged in its entirety. COMPARISON:  No priors. FINDINGS: CORONARY CALCIUM  SCORES: Left Main: 0 LAD: 0 LCx: 0 RCA: 0 Total Agatston Score: 0 MESA database percentile: N/A AORTA MEASUREMENTS: Ascending Aorta: 3.2 cm Descending Aorta:2.7 cm  OTHER FINDINGS: Within the visualized portions of the thorax there are no suspicious appearing pulmonary nodules or masses, there is no acute consolidative airspace disease, no pleural effusions, no pneumothorax and no lymphadenopathy. Visualized portions of the upper abdomen are unremarkable. There are no aggressive appearing lytic or blastic lesions noted in the visualized portions of the skeleton. IMPRESSION: 1. Patient's total coronary artery calcium  score is 0 which indicates a very low (but non-zero) risk of significant coronary artery atherosclerosis. 2. No significant incidental noncardiac findings are noted. Electronically Signed   By: Toribio Aye M.D.   On: 01/15/2023 12:06    Assessment & Plan:   Bradycardia- He is asx. -     EKG 12-Lead -     TSH; Future -     Basic metabolic panel with GFR; Future  Immunization due -     Heplisav-B  (HepB-CPG) Vaccine  Routine general medical examination at a health care facility- Exam completed, labs reviewed, vaccines reviewed and updated, cancer screenings addressed, pt ed material was given.  -     PSA; Future  Pure hypercholesterolemia- statin is not indicated. -     Hepatic function panel; Future -     CK; Future -     Basic metabolic panel with GFR; Future -     Lipid panel; Future  Gastroesophageal reflux disease with  esophagitis without hemorrhage -     CBC with Differential/Platelet; Future -     Basic metabolic panel with GFR; Future  High serum lipoprotein(a) -     Lipid panel; Future  Elevated CK- Elevated but stable.     Follow-up: Return in about 6 months (around 07/09/2024).  Debby Molt, MD

## 2024-02-11 ENCOUNTER — Ambulatory Visit (INDEPENDENT_AMBULATORY_CARE_PROVIDER_SITE_OTHER)

## 2024-02-11 DIAGNOSIS — Z23 Encounter for immunization: Secondary | ICD-10-CM

## 2024-02-11 NOTE — Progress Notes (Signed)
After obtaining consent, and per orders of Dr. Yetta Barre, injection of Hep B given by Ferdie Ping. Patient instructed to report any adverse reaction to me immediately.

## 2024-07-12 ENCOUNTER — Ambulatory Visit: Admitting: Internal Medicine

## 2024-08-11 ENCOUNTER — Ambulatory Visit: Admitting: Internal Medicine

## 2025-01-10 ENCOUNTER — Encounter: Admitting: Internal Medicine
